# Patient Record
Sex: Female | Born: 1935 | Race: White | Hispanic: No | Marital: Married | Smoking: Never smoker
Health system: Southern US, Community
[De-identification: ages and names within clinical notes are randomized; demographics above are authoritative.]

## PROBLEM LIST (undated history)

## (undated) DIAGNOSIS — R682 Dry mouth, unspecified: Secondary | ICD-10-CM

## (undated) DIAGNOSIS — F419 Anxiety disorder, unspecified: Secondary | ICD-10-CM

## (undated) DIAGNOSIS — E785 Hyperlipidemia, unspecified: Secondary | ICD-10-CM

## (undated) DIAGNOSIS — C801 Malignant (primary) neoplasm, unspecified: Secondary | ICD-10-CM

## (undated) DIAGNOSIS — F0280 Dementia in other diseases classified elsewhere without behavioral disturbance: Secondary | ICD-10-CM

## (undated) DIAGNOSIS — M1990 Unspecified osteoarthritis, unspecified site: Secondary | ICD-10-CM

## (undated) DIAGNOSIS — G309 Alzheimer's disease, unspecified: Secondary | ICD-10-CM

## (undated) DIAGNOSIS — F32A Depression, unspecified: Secondary | ICD-10-CM

## (undated) DIAGNOSIS — I10 Essential (primary) hypertension: Secondary | ICD-10-CM

## (undated) DIAGNOSIS — F329 Major depressive disorder, single episode, unspecified: Secondary | ICD-10-CM

## (undated) HISTORY — PX: CHOLECYSTECTOMY: SHX55

## (undated) HISTORY — PX: APPENDECTOMY: SHX54

## (undated) HISTORY — PX: ABDOMINAL HYSTERECTOMY: SHX81

## (undated) HISTORY — PX: REPLACEMENT TOTAL KNEE BILATERAL: SUR1225

---

## 1988-12-09 HISTORY — PX: MASTECTOMY: SHX3

## 2004-11-08 ENCOUNTER — Ambulatory Visit: Payer: Self-pay | Admitting: Unknown Physician Specialty

## 2004-11-15 ENCOUNTER — Other Ambulatory Visit: Payer: Self-pay

## 2004-11-22 ENCOUNTER — Ambulatory Visit: Payer: Self-pay | Admitting: Unknown Physician Specialty

## 2005-05-25 ENCOUNTER — Inpatient Hospital Stay: Payer: Self-pay | Admitting: Internal Medicine

## 2005-05-25 ENCOUNTER — Other Ambulatory Visit: Payer: Self-pay

## 2005-06-05 ENCOUNTER — Ambulatory Visit: Payer: Self-pay | Admitting: Internal Medicine

## 2005-06-25 ENCOUNTER — Ambulatory Visit: Payer: Self-pay | Admitting: Internal Medicine

## 2005-08-19 ENCOUNTER — Other Ambulatory Visit: Payer: Self-pay

## 2005-08-27 ENCOUNTER — Inpatient Hospital Stay: Payer: Self-pay | Admitting: General Practice

## 2006-06-25 ENCOUNTER — Ambulatory Visit: Payer: Self-pay | Admitting: General Practice

## 2006-07-25 ENCOUNTER — Ambulatory Visit: Payer: Self-pay | Admitting: General Practice

## 2007-06-09 ENCOUNTER — Ambulatory Visit: Payer: Self-pay | Admitting: General Practice

## 2007-07-07 ENCOUNTER — Emergency Department: Payer: Self-pay | Admitting: Emergency Medicine

## 2007-08-03 ENCOUNTER — Other Ambulatory Visit: Payer: Self-pay

## 2007-08-03 ENCOUNTER — Ambulatory Visit: Payer: Self-pay | Admitting: General Practice

## 2007-08-19 ENCOUNTER — Inpatient Hospital Stay: Payer: Self-pay | Admitting: General Practice

## 2007-09-07 ENCOUNTER — Encounter: Payer: Self-pay | Admitting: General Practice

## 2007-09-09 ENCOUNTER — Encounter: Payer: Self-pay | Admitting: General Practice

## 2007-09-29 ENCOUNTER — Ambulatory Visit: Payer: Self-pay | Admitting: Psychiatry

## 2007-10-10 ENCOUNTER — Encounter: Payer: Self-pay | Admitting: General Practice

## 2007-11-18 ENCOUNTER — Ambulatory Visit: Payer: Self-pay | Admitting: General Practice

## 2010-09-03 ENCOUNTER — Inpatient Hospital Stay: Payer: Self-pay | Admitting: Internal Medicine

## 2012-07-23 ENCOUNTER — Emergency Department: Payer: Self-pay | Admitting: Emergency Medicine

## 2012-07-23 LAB — COMPREHENSIVE METABOLIC PANEL
Albumin: 3.3 g/dL — ABNORMAL LOW (ref 3.4–5.0)
Anion Gap: 10 (ref 7–16)
BUN: 10 mg/dL (ref 7–18)
Bilirubin,Total: 0.5 mg/dL (ref 0.2–1.0)
Co2: 25 mmol/L (ref 21–32)
Creatinine: 0.8 mg/dL (ref 0.60–1.30)
EGFR (Non-African Amer.): >60
Glucose: 100 mg/dL — ABNORMAL HIGH (ref 65–99)
Osmolality: 256 (ref 275–301)
Potassium: 4.2 mmol/L (ref 3.5–5.1)
SGOT(AST): 27 U/L (ref 15–37)
Sodium: 128 mmol/L — ABNORMAL LOW (ref 136–145)
Total Protein: 7.4 g/dL (ref 6.4–8.2)

## 2012-07-23 LAB — CBC
HCT: 35.8 % (ref 35.0–47.0)
HGB: 11.9 g/dL — ABNORMAL LOW (ref 12.0–16.0)
MCH: 29.3 pg (ref 26.0–34.0)
MCV: 89 fL (ref 80–100)
RDW: 13.7 % (ref 11.5–14.5)
WBC: 8.4 10*3/uL (ref 3.6–11.0)

## 2012-07-23 LAB — URINALYSIS, COMPLETE
Nitrite: NEGATIVE
Ph: 6 (ref 4.5–8.0)
Protein: 100
RBC,UR: 39 /HPF (ref 0–5)
Specific Gravity: 1.021 (ref 1.003–1.030)
WBC UR: 14 /HPF (ref 0–5)

## 2013-04-24 ENCOUNTER — Emergency Department: Payer: Self-pay | Admitting: Emergency Medicine

## 2014-02-06 ENCOUNTER — Ambulatory Visit: Payer: Self-pay | Admitting: Internal Medicine

## 2014-02-13 ENCOUNTER — Inpatient Hospital Stay: Payer: Self-pay | Admitting: Family Medicine

## 2014-02-13 LAB — URINALYSIS, COMPLETE
BILIRUBIN, UR: NEGATIVE
Bacteria: NONE SEEN
Glucose,UR: NEGATIVE mg/dL (ref 0–75)
Ketone: NEGATIVE
Leukocyte Esterase: NEGATIVE
Nitrite: NEGATIVE
PH: 6 (ref 4.5–8.0)
PROTEIN: NEGATIVE
RBC,UR: 2 /HPF (ref 0–5)
Specific Gravity: 1.004 (ref 1.003–1.030)
Squamous Epithelial: 1
WBC UR: 1 /HPF (ref 0–5)

## 2014-02-13 LAB — CBC WITH DIFFERENTIAL/PLATELET
BASOS ABS: 0 10*3/uL (ref 0.0–0.1)
Basophil %: 0.7 %
EOS ABS: 0 10*3/uL (ref 0.0–0.7)
EOS PCT: 0.3 %
HCT: 31.9 % — AB (ref 35.0–47.0)
HGB: 10.2 g/dL — AB (ref 12.0–16.0)
LYMPHS ABS: 0.9 10*3/uL — AB (ref 1.0–3.6)
LYMPHS PCT: 13 %
MCH: 25 pg — ABNORMAL LOW (ref 26.0–34.0)
MCHC: 31.9 g/dL — ABNORMAL LOW (ref 32.0–36.0)
MCV: 79 fL — AB (ref 80–100)
MONOS PCT: 9.7 %
Monocyte #: 0.7 x10 3/mm (ref 0.2–0.9)
NEUTROS ABS: 5.4 10*3/uL (ref 1.4–6.5)
Neutrophil %: 76.3 %
Platelet: 315 10*3/uL (ref 150–440)
RBC: 4.07 10*6/uL (ref 3.80–5.20)
RDW: 15 % — AB (ref 11.5–14.5)
WBC: 7.1 10*3/uL (ref 3.6–11.0)

## 2014-02-13 LAB — BASIC METABOLIC PANEL
ANION GAP: 9 (ref 7–16)
BUN: 12 mg/dL (ref 7–18)
CO2: 25 mmol/L (ref 21–32)
Calcium, Total: 8.3 mg/dL — ABNORMAL LOW (ref 8.5–10.1)
Chloride: 81 mmol/L — ABNORMAL LOW (ref 98–107)
Creatinine: 0.49 mg/dL — ABNORMAL LOW (ref 0.60–1.30)
GLUCOSE: 97 mg/dL (ref 65–99)
OSMOLALITY: 233 (ref 275–301)
POTASSIUM: 3.3 mmol/L — AB (ref 3.5–5.1)
Sodium: 115 mmol/L — CL (ref 136–145)

## 2014-02-13 LAB — CK TOTAL AND CKMB (NOT AT ARMC)
CK, Total: 4014 U/L — ABNORMAL HIGH
CK-MB: 35.5 ng/mL — ABNORMAL HIGH (ref 0.5–3.6)

## 2014-02-13 LAB — TROPONIN I: Troponin-I: 0.03 ng/mL

## 2014-02-13 LAB — HEPATIC FUNCTION PANEL A (ARMC)
ALBUMIN: 2.6 g/dL — AB (ref 3.4–5.0)
ALK PHOS: 93 U/L
AST: 135 U/L — AB (ref 15–37)
Bilirubin, Direct: <0.1 mg/dL (ref 0.00–0.20)
Bilirubin,Total: 0.3 mg/dL (ref 0.2–1.0)
SGPT (ALT): 36 U/L (ref 12–78)
Total Protein: 7.2 g/dL (ref 6.4–8.2)

## 2014-02-13 LAB — MAGNESIUM: MAGNESIUM: 1.8 mg/dL

## 2014-02-14 LAB — COMPREHENSIVE METABOLIC PANEL
ALK PHOS: 78 U/L
AST: 89 U/L — AB (ref 15–37)
Albumin: 2.4 g/dL — ABNORMAL LOW (ref 3.4–5.0)
Anion Gap: 6 — ABNORMAL LOW (ref 7–16)
BILIRUBIN TOTAL: 0.2 mg/dL (ref 0.2–1.0)
BUN: 12 mg/dL (ref 7–18)
CO2: 26 mmol/L (ref 21–32)
Calcium, Total: 8.1 mg/dL — ABNORMAL LOW (ref 8.5–10.1)
Chloride: 97 mmol/L — ABNORMAL LOW (ref 98–107)
Creatinine: 0.47 mg/dL — ABNORMAL LOW (ref 0.60–1.30)
EGFR (African American): >60
GLUCOSE: 71 mg/dL (ref 65–99)
Osmolality: 257 (ref 275–301)
Potassium: 3.8 mmol/L (ref 3.5–5.1)
SGPT (ALT): 30 U/L (ref 12–78)
Sodium: 129 mmol/L — ABNORMAL LOW (ref 136–145)
Total Protein: 6.1 g/dL — ABNORMAL LOW (ref 6.4–8.2)

## 2014-02-14 LAB — CBC WITH DIFFERENTIAL/PLATELET
BASOS PCT: 0.7 %
Basophil #: 0 10*3/uL (ref 0.0–0.1)
EOS ABS: 0 10*3/uL (ref 0.0–0.7)
Eosinophil %: 0.6 %
HCT: 29.5 % — ABNORMAL LOW (ref 35.0–47.0)
HGB: 9.6 g/dL — ABNORMAL LOW (ref 12.0–16.0)
LYMPHS PCT: 25.3 %
Lymphocyte #: 1.1 10*3/uL (ref 1.0–3.6)
MCH: 25.6 pg — ABNORMAL LOW (ref 26.0–34.0)
MCHC: 32.4 g/dL (ref 32.0–36.0)
MCV: 79 fL — AB (ref 80–100)
Monocyte #: 0.6 x10 3/mm (ref 0.2–0.9)
Monocyte %: 13.4 %
Neutrophil #: 2.6 10*3/uL (ref 1.4–6.5)
Neutrophil %: 60 %
Platelet: 289 10*3/uL (ref 150–440)
RBC: 3.73 10*6/uL — AB (ref 3.80–5.20)
RDW: 15.5 % — ABNORMAL HIGH (ref 11.5–14.5)
WBC: 4.3 10*3/uL (ref 3.6–11.0)

## 2014-02-14 LAB — CK: CK, Total: 1955 U/L — ABNORMAL HIGH

## 2014-02-14 LAB — TSH: Thyroid Stimulating Horm: 1.55 u[IU]/mL

## 2014-02-15 LAB — CBC WITH DIFFERENTIAL/PLATELET
BASOS PCT: 0.8 %
Basophil #: 0 10*3/uL (ref 0.0–0.1)
EOS ABS: 0 10*3/uL (ref 0.0–0.7)
Eosinophil %: 0.7 %
HCT: 29.1 % — ABNORMAL LOW (ref 35.0–47.0)
HGB: 9.5 g/dL — ABNORMAL LOW (ref 12.0–16.0)
Lymphocyte #: 0.9 10*3/uL — ABNORMAL LOW (ref 1.0–3.6)
Lymphocyte %: 16.8 %
MCH: 26.3 pg (ref 26.0–34.0)
MCHC: 32.6 g/dL (ref 32.0–36.0)
MCV: 81 fL (ref 80–100)
MONO ABS: 0.7 x10 3/mm (ref 0.2–0.9)
Monocyte %: 13.4 %
NEUTROS PCT: 68.3 %
Neutrophil #: 3.7 10*3/uL (ref 1.4–6.5)
Platelet: 274 10*3/uL (ref 150–440)
RBC: 3.6 10*6/uL — ABNORMAL LOW (ref 3.80–5.20)
RDW: 15.9 % — ABNORMAL HIGH (ref 11.5–14.5)
WBC: 5.4 10*3/uL (ref 3.6–11.0)

## 2014-02-15 LAB — BASIC METABOLIC PANEL
ANION GAP: 3 — AB (ref 7–16)
BUN: 13 mg/dL (ref 7–18)
CALCIUM: 8.1 mg/dL — AB (ref 8.5–10.1)
Chloride: 104 mmol/L (ref 98–107)
Co2: 27 mmol/L (ref 21–32)
Creatinine: 0.5 mg/dL — ABNORMAL LOW (ref 0.60–1.30)
EGFR (African American): >60
GLUCOSE: 73 mg/dL (ref 65–99)
OSMOLALITY: 267 (ref 275–301)
POTASSIUM: 4.1 mmol/L (ref 3.5–5.1)
SODIUM: 134 mmol/L — AB (ref 136–145)

## 2014-02-15 LAB — CK
CK, TOTAL: 475 U/L — AB
CK, Total: 615 U/L — ABNORMAL HIGH

## 2014-02-16 LAB — CK: CK, Total: 396 U/L — ABNORMAL HIGH

## 2014-02-18 DIAGNOSIS — I10 Essential (primary) hypertension: Secondary | ICD-10-CM

## 2014-02-18 DIAGNOSIS — R404 Transient alteration of awareness: Secondary | ICD-10-CM

## 2014-02-18 DIAGNOSIS — M1990 Unspecified osteoarthritis, unspecified site: Secondary | ICD-10-CM

## 2014-02-18 DIAGNOSIS — K146 Glossodynia: Secondary | ICD-10-CM

## 2014-02-23 DIAGNOSIS — F430 Acute stress reaction: Secondary | ICD-10-CM

## 2014-02-23 DIAGNOSIS — R443 Hallucinations, unspecified: Secondary | ICD-10-CM

## 2014-02-25 DIAGNOSIS — R569 Unspecified convulsions: Secondary | ICD-10-CM

## 2014-03-09 ENCOUNTER — Ambulatory Visit: Payer: Self-pay | Admitting: Internal Medicine

## 2014-03-14 DIAGNOSIS — R609 Edema, unspecified: Secondary | ICD-10-CM

## 2014-07-11 ENCOUNTER — Ambulatory Visit: Payer: Self-pay | Admitting: Family Medicine

## 2015-04-01 NOTE — H&P (Signed)
PATIENT NAME:  Kelsey Morrison, Kelsey Morrison MR#:  [id number] DATE OF BIRTH:  1934-05-27  DATE OF ADMISSION:  02/13/2014  REFERRING PHYSICIAN: Dr. Kerman Passey  FAMILY PHYSICIAN: Dr. Jarome Lamas  REASON FOR ADMISSION: Altered mental status.   HISTORY OF PRESENT ILLNESS: The patient is a 81 year old female with a history of benign hypertension, breast cancer, osteoarthritis, and "burning mouth syndrome." Has been seen by an oral specialist in [place] for her burning mouth syndrome. Has painful eating and swallowing. Presents now with confusion. Brought by her family. Has not been eating or drinking as much over the past 3 to 4 days because of her mouth pain. In the Emergency Room, the patient was noted to have an elevated CPK consistent with rhabdomyolysis. She was also found to be profoundly hyponatremic and hypokalemic. She is now admitted for further evaluation. The patient is confused and unable to give history.   PAST MEDICAL HISTORY: 1.  Benign hypertension.  2.  Breast cancer, status post mastectomy.  3.  Osteoarthritis.  4.  Anxiety/depression.  5.  "Burning mouth syndrome."   6.  Status post left knee surgery.  7.  Status post cholecystectomy.  8.  Status post hysterectomy.   MEDICATIONS: 1.  Blood pressure pill, name and dose unknown.  2.  Klonopin 1 mg p.o. t.i.d. and 2 mg p.o. at bedtime.  3.  Neurontin 400 mg p.o. t.i.d.  4.  Lidocaine/Maalox suspension as needed.  6.  Mycelex troches 5 times per day.  7.  Percocet 5/325, 1 to 2 p.o. q. 6 hours p.r.n. pain.  8.  Vitamin C 500 mg p.o. daily.   ALLERGIES: NEXIUM, ZOCOR, ULTRAM, LIPITOR, CELEBREX, MOBIC, CIPRO, SULFA, and CELEXA.   SOCIAL HISTORY: Negative for alcohol or tobacco abuse.   FAMILY HISTORY: Noncontributory.   REVIEW OF SYSTEMS: Unable to obtain from patient.   PHYSICAL EXAMINATION: GENERAL: The patient is elderly, thin, cachectic, but in no acute distress.  VITAL SIGNS: Remarkable for a blood pressure of 132/66, heart  rate of 79, respiratory rate of 18, temperature of 97.6, sat 98% on room air.  HEENT: Normocephalic, atraumatic. Pupils equal, round and reactive to light and accommodation. Extraocular movements are intact. Sclerae are not icteric. Conjunctivae are clear. Oropharynx is dry, but clear.  NECK: Supple, without JVD. No adenopathy noted.  LUNGS: Clear to auscultation and percussion without wheezes, rales or rhonchi. No dullness. Respiratory effort is normal.  CARDIAC: Regular rate and rhythm with normal S1, S2. There are no rubs, murmurs or gallops. PMI nondisplaced. Chest wall is nontender.  ABDOMEN: Soft, nontender, with normoactive bowel sounds. No organomegaly or masses were appreciated. No hernias or bruits were noted.  EXTREMITIES: Without clubbing, cyanosis, edema. Pulses were 2+ bilaterally.  SKIN: Warm and dry, without rash or lesions.  NEUROLOGIC: Exam revealed cranial nerves II through XII grossly intact. Deep tendon reflexes are symmetric. Motor and sensory exams nonfocal.  PSYCHIATRIC: Exam revealed a patient who is alert and oriented to person only. She was not answering questions appropriately, and was repetitive.   LABORATORY DATA: Urinalysis was unremarkable. Glucose was 97, with a BUN of 12, a creatinine of 0.49, and a GFR of greater than 60. Her sodium was 115, with a potassium of 3.3. Albumin was 2.6. CK total was 4014, with an MB of 35.5. Troponin was 0.03. Magnesium 1.8. White count was 7.1, with a hemoglobin of 10.2.   ASSESSMENT: 1.  Anorexia due to mouth pain.  2.  Hyponatremia.  3.  Hypokalemia.  4.  Rhabdomyolysis.  5.  Anxiety/depression.  6.  History of breast cancer.  7.  Anemia of chronic disease.   PLAN: The patient will be admitted to the floor with IV fluids and potassium supplementation. Will begin a soft diet. Will hold her blood pressure medication at this time. Will consult ENT because of her mouth pain, as well as the dietitian for a calorie count and  assessment. Neuro checks q. 4 hours. Follow up routine labs in the morning. Further treatment and evaluation will depend upon the patient's progress.   Total time spent on this patient was 50 minutes.      ____________________________ Leonie Douglas Doy Hutching, MD jds:mr D: 02/13/2014 17:35:57 ET T: 02/13/2014 19:14:35 ET JOB#: [id number]  cc: Leonie Douglas. Doy Hutching, MD, <Dictator> Irven Easterly. Kary Kos, MD  JEFFREY Lennice Sites MD ELECTRONICALLY SIGNED 02/14/2014 7:57

## 2015-04-01 NOTE — Consult Note (Signed)
PATIENT NAME:  Kelsey Morrison, Kelsey Morrison MR#:  [id number] DATE OF BIRTH:  01/18/1934  DATE OF CONSULTATION:  02/14/2014  REFERRING PHYSICIAN:  Leonie Douglas. Doy Hutching, MD  CONSULTING PHYSICIAN:  Huey Romans, MD  FAMILY PHYSICIAN:  Irven Easterly. Kary Kos, MD  REASON FOR CONSULTATION:  Mouth and throat pain.   HISTORY OF PRESENT ILLNESS: The patient is a 81 year old white female with a history of burning mouth syndrome that has been going on now for 3 to 4 years according to the husband and 3 to 4 months according to the patient. She did say she has seen multiple doctors in [place] for it.  She was sent to [facility] to see what they could offer and then got sent to [facility] to see an oral specialist who diagnosed her with burning mouth syndrome and lichen planus. She was on some medication and felt like she was better in May and during some of the summer time, but the last several months it has gotten much worse.  She has seen Dr. Bridgett Larsson in regards to anxiety and depression and has been tried on multiple different medications by him to see if that would help solve the problem. She currently has been trying to control it with Duke's Magic Mouthwash, Biotene products, staying hydrated and recently she got much worse. She was admitted to the Emergency Room for not eating or drinking much in the past 3 to 4 days because of mouth pain and having elevated CPK consistent with rhabdomyolysis. She has been getting well hydrated here and her mouth seems to be better now. She is chewing gum currently to try to keep her mouth moist.   PAST MEDICAL HISTORY: Significant for by benign hypertension, breast cancer status post mastectomy, osteoarthritis, anxiety, depression and burning mouth syndrome.   PAST SURGICAL HISTORY: Significant for left knee surgery, cholecystectomy and hysterectomy.   CURRENT MEDICATIONS: Reviewed, as noted in the chart.   DRUG ALLERGIES: NEXIUM, ZOCOR, ULTRAM, LIPITOR, CELEBREX, MOBIC, CIPRO, SULFA AND  CELEXA.   SOCIAL HISTORY: Negative for alcohol or tobacco abuse.   PHYSICAL EXAMINATION:  HEENT:  The nose is open anteriorly. Her mouth shows her tongue to be moist. There are no oral lesions at all. No ulcerations on the tongue, dorsal surface nor the lateral surface.  Floor of mouth is clear. Teeth are in good repair.  There is no evidence of gum disease. There is no evidence of lichen planus on the cheeks, palate or the tongue at this time. There is no redness of the tongue or the posterior pharynx.  NECK: Negative for any nodes or masses.   IMPRESSION: The patient has had burning mouth syndrome, which has led to decreased oral intake and secondary rhabdomyolysis, now being well hydrated and her symptoms have improved . There is no specific cure, but topical treatment and oral care seems to be the best care.  She has been tried on oral medications to control nerve irritation by Dr. Bridgett Larsson and they have not been helpful.   PLAN:  Patient needs to continue with Duke's Magic Mouthwash for topical control. She needs to stay well hydrated and make sure her mouth does not dry out. She will continue to take liquids. She can chew on gum but should be a little careful about xylitol and sugar-free gum as that can be an irritant to the tissues. There is no evidence of yeast infection and she probably does not need to the Mycelex Troches long-term. She could use prednisone Dosepak periodically  when symptoms seem to worsen as this will help settle down symptoms temporarily, but will not solve the problem long-term. She also will use Biotene products for dental care and other oral mouth washes to help keep things lubricated as this seems to be the best over-the-counter products for her problem.   Thank you for the opportunity to assist in this patient's care.     ____________________________ Huey Romans, MD phj:mk D: 02/14/2014 20:44:06 ET T: 02/14/2014 21:08:41 ET JOB#: [id number]  cc: Huey Romans,  MD, <Dictator> Leonie Douglas. Doy Hutching, MD  Huey Romans MD ELECTRONICALLY SIGNED 02/21/2014 7:33

## 2015-04-01 NOTE — Discharge Summary (Signed)
PATIENT NAME:  Kelsey Morrison, Kelsey Morrison MR#:  [id number] DATE OF BIRTH:  August 12, 1934  DATE OF ADMISSION:  02/13/2014 DATE OF DISCHARGE:  02/16/2014  DISCHARGE DIAGNOSES: 1.  Burning mouth syndrome, improving and comfortable with Magic Mouthwash.  2.  Hyponatremia, resolved with hydration.  3.  Hypokalemia, repleted and resolved.  4.  Rhabdomyolysis, resolved with hydration. 5.  Acute delirium with possible underlying dementia, improving. Will require outpatient neurological followup.  SECONDARY DIAGNOSES: 1.  Hypertension.  2.  Breast cancer, status post mastectomy. 3.  Osteoarthritis.  4.  Anxiety/depression.  5.  Burning mouth syndrome.   CONSULTATIONS:  1.  ENT, Dr. Kathyrn Sheriff. 2.  Palliative care, Dr. Ermalinda Memos.  3.  Physical and occupational therapy.   PROCEDURES AND RADIOLOGY: None.   MAJOR LABORATORY PANEL: Urinalysis negative.   HISTORY AND SHORT HOSPITAL COURSE: The patient is a 81 year old female with above-mentioned medical problems who was admitted for altered mental status and anorexia, which was thought to be related to burning mouth syndrome. Please see Dr. Doy Hutching' dictated history and physical for further details. ENT consultation was obtained with Dr. Kathyrn Sheriff who recommended continue Magic Mouthwash and use steroid burst periodically for any flare-ups. He also recommended starting good multivitamin with multiminerals, which was done. The patient was instructed to use any Biotene products with good topical medication like Magic Mouthwash and candies to keep her mouth moist.   The patient was evaluated with palliative care, and decision was made to continue full aggressive care as now. After evaluation by physical and occupational therapy, decision was made to transfer her to rehab facility, and family was in agreement. On the date of discharge, her vital signs were as follows: Temperature 97.6, heart rate 82 per minute, respirations 18 per minute, blood pressure 126/65 mmHg. She was  saturating 97% on room air.   PERTINENT PHYSICAL EXAMINATION ON THE DATE OF DISCHARGE:  CARDIOVASCULAR: S1, S2 normal. No murmurs, rubs, or gallop.  LUNGS: Clear to auscultation bilaterally. No wheezing, rales, rhonchi, or crepitation.  ABDOMEN: Soft, benign.  NEUROLOGIC: Nonfocal examination.   All other physical examination remained at baseline.   DISCHARGE MEDICATIONS: 1.  Amlodipine 5 mg p.o. daily. 2.  Clotrimazole 10 mg p.o. b.i.d. as needed.  3.  Mirtazapine 15 mg 1/2 tablet p.o. at bedtime.  4.  Vitamin with multiminerals once daily.  5.  Duke's Magic Mouthwash 5 mL p.o. 5 times a day.  6.  Clonazepam 1 mg p.o. 4 times a day as needed.  7.  Ascorbic acid 500 mg p.o. daily. 8.  Saliva substitutes 4 sprays orally at bedtime.   DISCHARGE DIET: Low-sodium.   DISCHARGE ACTIVITY: As tolerated.   DISCHARGE INSTRUCTIONS AND FOLLOWUP: The patient was instructed to follow up with her primary care physician, Dr. Jarome Lamas, in 1 to 2 weeks. She will need followup with neurology from [facility] in 2 to 4 weeks and Dr. Kathyrn Sheriff from ENT in 4 to 6 weeks. She will get physical therapy evaluation and management while at the facility. She may need a GI followup with [facility] as needed for G-tube placement if family wishes and if patient in agreement.   TOTAL TIME DISCHARGING THIS PATIENT: 55 minutes.   ____________________________ Lucina Mellow. Manuella Ghazi, MD vss:jcm D: 02/16/2014 15:55:45 ET T: 02/16/2014 16:53:54 ET JOB#: [id number]  cc: Lissette Schenk S. Manuella Ghazi, MD, <Dictator> Irven Easterly. Kary Kos, MD Huey Romans, MD [facility] Lucina Mellow [facility] MD ELECTRONICALLY SIGNED 02/17/2014 19:55

## 2015-04-01 NOTE — Consult Note (Signed)
   Comments   Follow up visit made. Pt's husband now present. He says that pt has been tried on multiple medications including gabapentin. He is unsure of others but said pt's PCP, Dr Bridgett Larsson, would know. Will contact him.  the option of feeding tube with pt/husband as well as code status. Both pt and husband seem uncertain about goals. Husband is clear that pt would not want intubation but thinks she would want CPR/debibrillation. Pt says that she wants intubation but then changed her mind when she realized that she could not go home on a vent. Ultimately, we asked pt and husband to discuss further and include her son in discussion. They agreed to do this. We will follow up tomorrow.   Electronic Signatures: Trig Mcbryar, Izora Gala (MD)  (Signed 09-Mar-15 20:31)  Authored: Palliative Care   Last Updated: 09-Mar-15 20:31 by Graycie Halley, Izora Gala (MD)

## 2015-07-25 ENCOUNTER — Encounter: Payer: Self-pay | Admitting: Emergency Medicine

## 2015-07-25 ENCOUNTER — Observation Stay
Admission: EM | Admit: 2015-07-25 | Discharge: 2015-07-27 | Disposition: A | Discharge status: Home / Self Care | Payer: Medicare Other | Attending: Internal Medicine | Admitting: Internal Medicine

## 2015-07-25 DIAGNOSIS — Z7982 Long term (current) use of aspirin: Secondary | ICD-10-CM | POA: Insufficient documentation

## 2015-07-25 DIAGNOSIS — M064 Inflammatory polyarthropathy: Secondary | ICD-10-CM | POA: Diagnosis not present

## 2015-07-25 DIAGNOSIS — M1380 Other specified arthritis, unspecified site: Secondary | ICD-10-CM | POA: Insufficient documentation

## 2015-07-25 DIAGNOSIS — Z803 Family history of malignant neoplasm of breast: Secondary | ICD-10-CM | POA: Insufficient documentation

## 2015-07-25 DIAGNOSIS — Z90710 Acquired absence of both cervix and uterus: Secondary | ICD-10-CM | POA: Insufficient documentation

## 2015-07-25 DIAGNOSIS — R682 Dry mouth, unspecified: Secondary | ICD-10-CM | POA: Diagnosis not present

## 2015-07-25 DIAGNOSIS — M1990 Unspecified osteoarthritis, unspecified site: Secondary | ICD-10-CM | POA: Insufficient documentation

## 2015-07-25 DIAGNOSIS — F0280 Dementia in other diseases classified elsewhere without behavioral disturbance: Secondary | ICD-10-CM

## 2015-07-25 DIAGNOSIS — Z853 Personal history of malignant neoplasm of breast: Secondary | ICD-10-CM | POA: Insufficient documentation

## 2015-07-25 DIAGNOSIS — I351 Nonrheumatic aortic (valve) insufficiency: Secondary | ICD-10-CM | POA: Diagnosis not present

## 2015-07-25 DIAGNOSIS — I6523 Occlusion and stenosis of bilateral carotid arteries: Secondary | ICD-10-CM | POA: Insufficient documentation

## 2015-07-25 DIAGNOSIS — Z79899 Other long term (current) drug therapy: Secondary | ICD-10-CM | POA: Diagnosis not present

## 2015-07-25 DIAGNOSIS — Z888 Allergy status to other drugs, medicaments and biological substances status: Secondary | ICD-10-CM | POA: Insufficient documentation

## 2015-07-25 DIAGNOSIS — Z9049 Acquired absence of other specified parts of digestive tract: Secondary | ICD-10-CM | POA: Diagnosis not present

## 2015-07-25 DIAGNOSIS — F419 Anxiety disorder, unspecified: Secondary | ICD-10-CM | POA: Diagnosis not present

## 2015-07-25 DIAGNOSIS — I10 Essential (primary) hypertension: Secondary | ICD-10-CM | POA: Diagnosis not present

## 2015-07-25 DIAGNOSIS — E785 Hyperlipidemia, unspecified: Secondary | ICD-10-CM | POA: Insufficient documentation

## 2015-07-25 DIAGNOSIS — Z825 Family history of asthma and other chronic lower respiratory diseases: Secondary | ICD-10-CM | POA: Diagnosis not present

## 2015-07-25 DIAGNOSIS — G309 Alzheimer's disease, unspecified: Secondary | ICD-10-CM | POA: Diagnosis not present

## 2015-07-25 DIAGNOSIS — Z96653 Presence of artificial knee joint, bilateral: Secondary | ICD-10-CM | POA: Diagnosis not present

## 2015-07-25 DIAGNOSIS — I352 Nonrheumatic aortic (valve) stenosis with insufficiency: Secondary | ICD-10-CM | POA: Diagnosis not present

## 2015-07-25 DIAGNOSIS — R55 Syncope and collapse: Principal | ICD-10-CM | POA: Insufficient documentation

## 2015-07-25 DIAGNOSIS — F329 Major depressive disorder, single episode, unspecified: Secondary | ICD-10-CM | POA: Insufficient documentation

## 2015-07-25 DIAGNOSIS — Z885 Allergy status to narcotic agent status: Secondary | ICD-10-CM | POA: Diagnosis not present

## 2015-07-25 HISTORY — DX: Dementia in other diseases classified elsewhere, unspecified severity, without behavioral disturbance, psychotic disturbance, mood disturbance, and anxiety: F02.80

## 2015-07-25 HISTORY — DX: Dry mouth, unspecified: R68.2

## 2015-07-25 HISTORY — DX: Alzheimer's disease, unspecified: G30.9

## 2015-07-25 HISTORY — DX: Essential (primary) hypertension: I10

## 2015-07-25 HISTORY — DX: Hyperlipidemia, unspecified: E78.5

## 2015-07-25 HISTORY — DX: Unspecified osteoarthritis, unspecified site: M19.90

## 2015-07-25 HISTORY — DX: Major depressive disorder, single episode, unspecified: F32.9

## 2015-07-25 HISTORY — DX: Depression, unspecified: F32.A

## 2015-07-25 HISTORY — DX: Anxiety disorder, unspecified: F41.9

## 2015-07-25 HISTORY — DX: Malignant (primary) neoplasm, unspecified: C80.1

## 2015-07-25 LAB — URINALYSIS COMPLETE WITH MICROSCOPIC (ARMC ONLY)
BACTERIA UA: NONE SEEN
Bilirubin Urine: NEGATIVE
GLUCOSE, UA: NEGATIVE mg/dL
Ketones, ur: NEGATIVE mg/dL
NITRITE: NEGATIVE
PROTEIN: NEGATIVE mg/dL
SPECIFIC GRAVITY, URINE: 1.005 (ref 1.005–1.030)
pH: 7 (ref 5.0–8.0)

## 2015-07-25 LAB — BASIC METABOLIC PANEL
Anion gap: 11 (ref 5–15)
BUN: 14 mg/dL (ref 6–20)
CALCIUM: 9 mg/dL (ref 8.9–10.3)
CO2: 25 mmol/L (ref 22–32)
CREATININE: 0.91 mg/dL (ref 0.44–1.00)
Chloride: 100 mmol/L — ABNORMAL LOW (ref 101–111)
GFR calc non Af Amer: 59 mL/min — ABNORMAL LOW (ref >60)
Glucose, Bld: 124 mg/dL — ABNORMAL HIGH (ref 65–99)
Potassium: 3.4 mmol/L — ABNORMAL LOW (ref 3.5–5.1)
Sodium: 136 mmol/L (ref 135–145)

## 2015-07-25 LAB — MAGNESIUM: MAGNESIUM: 1.9 mg/dL (ref 1.7–2.4)

## 2015-07-25 LAB — CBC
HCT: 38 % (ref 35.0–47.0)
Hemoglobin: 12.1 g/dL (ref 12.0–16.0)
MCH: 28.9 pg (ref 26.0–34.0)
MCHC: 31.8 g/dL — ABNORMAL LOW (ref 32.0–36.0)
MCV: 90.9 fL (ref 80.0–100.0)
PLATELETS: 240 10*3/uL (ref 150–440)
RBC: 4.18 MIL/uL (ref 3.80–5.20)
RDW: 14.3 % (ref 11.5–14.5)
WBC: 12.6 10*3/uL — ABNORMAL HIGH (ref 3.6–11.0)

## 2015-07-25 LAB — GLUCOSE, CAPILLARY: GLUCOSE-CAPILLARY: 124 mg/dL — AB (ref 65–99)

## 2015-07-25 LAB — TSH: TSH: 1.12 u[IU]/mL (ref 0.350–4.500)

## 2015-07-25 LAB — TROPONIN I: Troponin I: <0.03 ng/mL (ref <0.031)

## 2015-07-25 LAB — PHOSPHORUS: Phosphorus: 3.6 mg/dL (ref 2.5–4.6)

## 2015-07-25 MED ORDER — PREDNISONE 5 MG PO TABS
5.0000 mg | ORAL_TABLET | Freq: Every day | ORAL | Status: DC
  Administered 2015-07-26 – 2015-07-27 (×2): 5 mg via ORAL
  Filled 2015-07-25 (×2): qty 1

## 2015-07-25 MED ORDER — ACETAMINOPHEN 650 MG RE SUPP: 650.0000 mg | Freq: Four times a day (QID) | RECTAL | Status: DC | PRN

## 2015-07-25 MED ORDER — SODIUM CHLORIDE 0.9 % IV SOLN
INTRAVENOUS | Status: AC
Start: 2015-07-25 — End: 2015-07-26
  Administered 2015-07-25 – 2015-07-26 (×2): via INTRAVENOUS

## 2015-07-25 MED ORDER — SODIUM CHLORIDE 0.9 % IJ SOLN
3.0000 mL | Freq: Two times a day (BID) | INTRAMUSCULAR | Status: DC
  Administered 2015-07-26 – 2015-07-27 (×2): 3 mL via INTRAVENOUS

## 2015-07-25 MED ORDER — SODIUM CHLORIDE 0.9 % IV BOLUS (SEPSIS)
1000.0000 mL | Freq: Once | INTRAVENOUS | Status: AC
  Administered 2015-07-25: 1000 mL via INTRAVENOUS

## 2015-07-25 MED ORDER — MELATONIN 5 MG PO TABS: 1.0000 | ORAL_TABLET | Freq: Every day | ORAL | Status: DC

## 2015-07-25 MED ORDER — BIOTENE DRY MOUTH MT LIQD
15.0000 mL | Freq: Every day | OROMUCOSAL | Status: DC
  Administered 2015-07-25 – 2015-07-26 (×2): 15 mL via OROMUCOSAL
  Filled 2015-07-25 (×3): qty 15

## 2015-07-25 MED ORDER — DIPHENHYDRAMINE HCL 25 MG PO CAPS
25.0000 mg | ORAL_CAPSULE | Freq: Every evening | ORAL | Status: DC | PRN
  Administered 2015-07-25 – 2015-07-26 (×2): 25 mg via ORAL
  Filled 2015-07-25 (×2): qty 1

## 2015-07-25 MED ORDER — SALINE SPRAY 0.65 % NA SOLN: 2.0000 | NASAL | Status: DC | PRN

## 2015-07-25 MED ORDER — ENOXAPARIN SODIUM 40 MG/0.4ML ~~LOC~~ SOLN
40.0000 mg | SUBCUTANEOUS | Status: DC
  Administered 2015-07-26: 40 mg via SUBCUTANEOUS
  Filled 2015-07-25: qty 0.4

## 2015-07-25 MED ORDER — CLOTRIMAZOLE 10 MG MT TROC
10.0000 mg | Freq: Three times a day (TID) | OROMUCOSAL | Status: DC
  Administered 2015-07-25 – 2015-07-27 (×4): 10 mg via ORAL
  Filled 2015-07-25 (×7): qty 1

## 2015-07-25 MED ORDER — AMLODIPINE BESYLATE 5 MG PO TABS
5.0000 mg | ORAL_TABLET | Freq: Every day | ORAL | Status: DC
  Administered 2015-07-26 – 2015-07-27 (×2): 5 mg via ORAL
  Filled 2015-07-25 (×2): qty 1

## 2015-07-25 MED ORDER — MIRTAZAPINE 15 MG PO TABS
7.5000 mg | ORAL_TABLET | Freq: Every day | ORAL | Status: DC
  Administered 2015-07-25 – 2015-07-26 (×2): 7.5 mg via ORAL
  Filled 2015-07-25 (×2): qty 1

## 2015-07-25 MED ORDER — CLONAZEPAM 0.5 MG PO TABS
0.5000 mg | ORAL_TABLET | Freq: Every day | ORAL | Status: DC
  Administered 2015-07-25 – 2015-07-26 (×2): 0.5 mg via ORAL
  Filled 2015-07-25 (×2): qty 1

## 2015-07-25 MED ORDER — ACETAMINOPHEN 325 MG PO TABS: 650.0000 mg | ORAL_TABLET | Freq: Four times a day (QID) | ORAL | Status: DC | PRN

## 2015-07-25 MED ORDER — ASPIRIN EC 81 MG PO TBEC
81.0000 mg | DELAYED_RELEASE_TABLET | Freq: Every day | ORAL | Status: DC
  Administered 2015-07-25 – 2015-07-26 (×2): 81 mg via ORAL
  Filled 2015-07-25 (×2): qty 1

## 2015-07-25 MED ORDER — OLANZAPINE 2.5 MG PO TABS
1.2500 mg | ORAL_TABLET | Freq: Two times a day (BID) | ORAL | Status: DC
  Administered 2015-07-25 – 2015-07-27 (×4): 1.25 mg via ORAL
  Filled 2015-07-25 (×3): qty 0.5
  Filled 2015-07-25 (×2): qty 1

## 2015-07-25 MED ORDER — MEMANTINE HCL 10 MG PO TABS
10.0000 mg | ORAL_TABLET | Freq: Two times a day (BID) | ORAL | Status: DC
  Administered 2015-07-25 – 2015-07-27 (×4): 10 mg via ORAL
  Filled 2015-07-25 (×5): qty 1

## 2015-07-25 NOTE — H&P (Signed)
[facility] [place] NAME: Kelsey Morrison    MR#:  [id number]  DATE OF BIRTH:  15-Aug-1934  DATE OF ADMISSION:  07/25/2015  PRIMARY CARE PHYSICIAN: [place] Pink, MD   REQUESTING/REFERRING PHYSICIAN: Jacqualine Code, MD  CHIEF COMPLAINT:   Chief Complaint  Patient presents with  . Fall    HISTORY OF PRESENT ILLNESS:  Kelsey Morrison  is a 81 y.o. female who presents with multiple episodes of syncope today. Patient has dementia and is unable to give a complete history, but she is accompanied by her husband in the ED today reports that during breakfast she had a syncopal episode when she stood up. He states that he is unsure how long she was out, but when he got her up from the floor she passed out again. He then brought her to the ED for evaluation, and the patient had 2 further syncopal episodes here in the ED, one of them was in a seated position wheelchair. Initial evaluation in the ED shows no significant abnormalities in her labs, orthostatic vital signs were within normal limits. Patient denies any prior diagnosis of arrhythmia or cardiac problems. Hospitalists were called for admission for syncope.  PAST MEDICAL HISTORY:   Past Medical History  Diagnosis Date  . Anxiety   . Hypertension   . Alzheimer's dementia   . Cancer     breast cancer  . Dry mouth   . Osteoarthritis   . Depression   . HLD (hyperlipidemia)   . Inflammatory arthritis     seronegative    PAST SURGICAL HISTORY:   Past Surgical History  Procedure Laterality Date  . Abdominal hysterectomy    . Replacement total knee bilateral    . Mastectomy Right 1990  . Cholecystectomy    . Appendectomy      SOCIAL HISTORY:   Social History  Substance Use Topics  . Smoking status: Never Smoker   . Smokeless tobacco: Not on file  . Alcohol Use: No    FAMILY HISTORY:   Family History  Problem Relation Age of Onset  . COPD Sister   . Breast cancer Sister   .  Breast cancer Mother     DRUG ALLERGIES:   Allergies  Allergen Reactions  . Celecoxib Other (See Comments)  . Hydrocodone Other (See Comments)    Altered mental status    MEDICATIONS AT HOME:   Prior to Admission medications   Medication Sig Start Date End Date Taking? Authorizing Provider  amLODipine (NORVASC) 5 MG tablet Take 5 mg by mouth daily.   Yes Historical Provider, MD  antiseptic oral rinse (BIOTENE) LIQD 15 mLs by Mouth Rinse route at bedtime.   Yes Historical Provider, MD  aspirin EC 81 MG tablet Take 81 mg by mouth at bedtime.   Yes Historical Provider, MD  calcium citrate (CALCITRATE - DOSED IN MG ELEMENTAL CALCIUM) 950 MG tablet Take 200 mg of elemental calcium by mouth daily.   Yes Historical Provider, MD  clonazePAM (KLONOPIN) 0.5 MG tablet Take 0.5 mg by mouth at bedtime.   Yes Historical Provider, MD  clotrimazole (MYCELEX) 10 MG troche Take 10 mg by mouth 3 (three) times daily.   Yes Historical Provider, MD  Melatonin 5 MG TABS Take 1 tablet by mouth at bedtime.   Yes Historical Provider, MD  memantine (NAMENDA) 10 MG tablet Take 10 mg by mouth 2 (two) times daily.   Yes Historical Provider, MD  mirtazapine (REMERON) 7.5 MG  tablet Take 7.5 mg by mouth at bedtime.   Yes Historical Provider, MD  OLANZapine (ZYPREXA) 2.5 MG tablet Take 1.25 mg by mouth 2 (two) times daily.   Yes Historical Provider, MD  predniSONE (DELTASONE) 5 MG tablet Take 5 mg by mouth daily with breakfast.   Yes Historical Provider, MD    REVIEW OF SYSTEMS:  Review of Systems  Constitutional: Negative for fever, chills, weight loss and malaise/fatigue.  HENT: Negative for ear pain, hearing loss and tinnitus.   Eyes: Negative for blurred vision, double vision, pain and redness.  Respiratory: Negative for cough, hemoptysis and shortness of breath.   Cardiovascular: Negative for chest pain, palpitations, orthopnea and leg swelling.  Gastrointestinal: Negative for nausea, vomiting, abdominal  pain, diarrhea and constipation.  Genitourinary: Negative for dysuria, frequency and hematuria.  Musculoskeletal: Negative for back pain, joint pain and neck pain.  Skin:       No acne, rash, or lesions  Neurological: Positive for loss of consciousness (4 syncopal episodes today.). Negative for dizziness, tremors, focal weakness and weakness.  Endo/Heme/Allergies: Negative for polydipsia. Does not bruise/bleed easily.  Psychiatric/Behavioral: Negative for depression. The patient is not nervous/anxious and does not have insomnia.      VITAL SIGNS:   Filed Vitals:   07/25/15 1230 07/25/15 1300 07/25/15 1330 07/25/15 1400  BP: 149/72 147/67 154/73 161/83  Pulse: 84 82 88 90  Temp:      TempSrc:      Resp: 18 12 25 17   Height:      Weight:      SpO2: 100% 97% 96% 94%   Wt Readings from Last 3 Encounters:  07/25/15 61.236 kg (135 lb)    PHYSICAL EXAMINATION:  Physical Exam  Vitals reviewed. Constitutional: She is oriented to person, place, and time. She appears well-developed and well-nourished. No distress.  HENT:  Head: Normocephalic and atraumatic.  Mouth/Throat: Oropharynx is clear and moist.  Eyes: Conjunctivae and EOM are normal. Pupils are equal, round, and reactive to light. No scleral icterus.  Neck: Normal range of motion. Neck supple. No JVD present. No thyromegaly present.  Cardiovascular: Normal rate, regular rhythm and intact distal pulses.  Exam reveals no gallop and no friction rub.   No murmur heard. Respiratory: Effort normal and breath sounds normal. No respiratory distress. She has no wheezes. She has no rales.  GI: Soft. Bowel sounds are normal. She exhibits no distension. There is no tenderness.  Musculoskeletal: Normal range of motion. She exhibits no edema.  No arthritis, no gout  Lymphadenopathy:    She has no cervical adenopathy.  Neurological: She is alert and oriented to person, place, and time. No cranial nerve deficit.  No dysarthria, no aphasia   Skin: Skin is warm and dry. No rash noted. No erythema.  Psychiatric: She has a normal mood and affect. Her behavior is normal. Judgment and thought content normal.  Mild dementia    LABORATORY PANEL:   CBC  Recent Labs Lab 07/25/15 1104  WBC 12.6*  HGB 12.1  HCT 38.0  PLT 240   ------------------------------------------------------------------------------------------------------------------  Chemistries   Recent Labs Lab 07/25/15 1104  NA 136  K 3.4*  CL 100*  CO2 25  GLUCOSE 124*  BUN 14  CREATININE 0.91  CALCIUM 9.0   ------------------------------------------------------------------------------------------------------------------  Cardiac Enzymes  Recent Labs Lab 07/25/15 1104  TROPONINI <0.03   ------------------------------------------------------------------------------------------------------------------  RADIOLOGY:  No results found.  EKG:   Orders placed or performed during the hospital encounter of 07/25/15  .  ED EKG  . ED EKG    IMPRESSION AND PLAN:  Principal Problem:   Syncope and collapse - unclear etiology at this time. No report of seizure activity, bowel or bladder incontinence, postictal state. On exam, patient does not have any focal weakness or other focal deficit. At this time, some intermittent arrhythmia is a high likelihood as a cause for his syncopal episodes. We'll keep her on telemetry overnight, get an echocardiogram, a cardiology consult, trend her troponins. Based on the results of this initial workup, could consider carotid Dopplers as well. Active Problems:   HTN (hypertension) - continue home medication to control this.   Anxiety - continue home medication for this.   Alzheimer's dementia - continue home meds for this.   Inflammatory arthritis - continue home medications for this   Dry mouth - continue home meds  All the records are reviewed and case discussed with ED provider. Management plans discussed with the  patient and/or family.  DVT PROPHYLAXIS: SubQ lovenox  ADMISSION STATUS: Observation  CODE STATUS: Full Advance Directive Documentation        Most Recent Value   Type of Advance Directive  Healthcare Power of Attorney   Pre-existing out of facility DNR order (yellow form or pink MOST form)     "MOST" Form in Place?        TOTAL TIME TAKING CARE OF THIS PATIENT: 40 minutes.    Kelsey Morrison FIELDING 07/25/2015, 3:27 PM  [facility]  Office  [phone]  CC: Primary care physician; [place] Pink, MD

## 2015-07-25 NOTE — ED Notes (Signed)
Pt from triage responsive to pain, EDT reports she was starting the EKG on the patient and pt became unresponsive. Pt slowly became responsive in room, able to tell this RN her name and where she was. Pt denies any pain at this time. Family reports pt had similar episodes this morning.

## 2015-07-25 NOTE — ED Provider Notes (Signed)
[facility] Emergency Department Provider Note  ____________________________________________  Time seen: Approximately 1215 PM  I have reviewed the triage vital signs and the nursing notes.   HISTORY  Chief Complaint Fall    HPI Kelsey Morrison is a 81 y.o. female history of hypertension, Alzheimer's disease who is seated having breakfast this morning at [place] her husband witnessed her suddenly fall off and blackout. He reports that she became very weak, passed out and then fell to the ground. She denies any headache or head injury. No neck pain. No chest pain or trouble breathing. No other concerns or symptoms. He stood her up, and she became weak and then passed out yet again for about 30 seconds.  About a the emergency room, while sitting up to have her blood pressure taken in the emergency room, she passed out and additional 2 times for approximately 30 seconds in the seated position. Her symptoms improved with laying flat.  No pain. Son and family reports she eats very poorly, and has been dehydrated in the past. Son also reports she has a history of low blood count in the past and had a previous transfusion perhaps once.  Patient does have some dementia, and poor recall but does not recall passing out twice at triage as witnessed by nurse.   Past Medical History  Diagnosis Date  . Anxiety   . Hypertension   . Alzheimer's dementia   . Cancer     breast cancer  . Dry mouth   . Osteoarthritis   . Depression   . HLD (hyperlipidemia)     There are no active problems to display for this patient.   Past Surgical History  Procedure Laterality Date  . Abdominal hysterectomy    . Replacement total knee bilateral    . Mastectomy Right 1990  . Cholecystectomy    . Appendectomy      Current Outpatient Rx  Name  Route  Sig  Dispense  Refill  . amLODipine (NORVASC) 5 MG tablet   Oral   Take 5 mg by mouth daily.         Marland Kitchen antiseptic oral  rinse (BIOTENE) LIQD   Mouth Rinse   15 mLs by Mouth Rinse route at bedtime.         Marland Kitchen aspirin EC 81 MG tablet   Oral   Take 81 mg by mouth at bedtime.         . calcium citrate (CALCITRATE - DOSED IN MG ELEMENTAL CALCIUM) 950 MG tablet   Oral   Take 200 mg of elemental calcium by mouth daily.         . clonazePAM (KLONOPIN) 0.5 MG tablet   Oral   Take 0.5 mg by mouth at bedtime.         . clotrimazole (MYCELEX) 10 MG troche   Oral   Take 10 mg by mouth 3 (three) times daily.         . Melatonin 5 MG TABS   Oral   Take 1 tablet by mouth at bedtime.         . memantine (NAMENDA) 10 MG tablet   Oral   Take 10 mg by mouth 2 (two) times daily.         . mirtazapine (REMERON) 7.5 MG tablet   Oral   Take 7.5 mg by mouth at bedtime.         Marland Kitchen OLANZapine (ZYPREXA) 2.5 MG tablet   Oral  Take 1.25 mg by mouth 2 (two) times daily.         . predniSONE (DELTASONE) 5 MG tablet   Oral   Take 5 mg by mouth daily with breakfast.           Allergies Celecoxib and Hydrocodone  Family History  Problem Relation Age of Onset  . COPD Sister   . Breast cancer Sister   . Breast cancer Mother     Social History Social History  Substance Use Topics  . Smoking status: Never Smoker   . Smokeless tobacco: None  . Alcohol Use: No    Review of Systems Constitutional: No fever/chills Eyes: No visual changes. ENT: No sore throat. Cardiovascular: Denies chest pain. Respiratory: Denies shortness of breath. Gastrointestinal: No abdominal pain.  No nausea, no vomiting.  No diarrhea.  No constipation. Genitourinary: Negative for dysuria. Musculoskeletal: Negative for back pain. Skin: Negative for rash. Neurological: Negative for headaches, focal weakness or numbness.    10-point ROS otherwise negative.  ____________________________________________   PHYSICAL EXAM:  VITAL SIGNS: ED Triage Vitals  Enc Vitals Group     BP 07/25/15 1033 123/61 mmHg      Pulse Rate 07/25/15 1033 83     Resp 07/25/15 1033 20     Temp 07/25/15 1033 98.2 F (36.8 C)     Temp Source 07/25/15 1033 Oral     SpO2 07/25/15 1033 100 %     Weight 07/25/15 1033 135 lb (61.236 kg)     Height 07/25/15 1033 5\' 6"  (1.676 m)     Head Cir --      Peak Flow --      Pain Score 07/25/15 1034 3     Pain Loc --      Pain Edu? --      Excl. in [place]? --     Constitutional: Alert and oriented to self, place, but not recalling passing out at triage. Well appearing and in no acute distress. Eyes: Conjunctivae are normal. PERRL. EOMI. Head: Atraumatic. No cervical spine tenderness. Nose: No congestion/rhinnorhea. No hemotympanum. Mouth/Throat: Mucous membranes are moist.  Oropharynx non-erythematous. Neck: No stridor.   Cardiovascular: Normal rate, regular rhythm. Grossly normal heart sounds.  Good peripheral circulation. Respiratory: Normal respiratory effort.  No retractions. Lungs CTAB. Gastrointestinal: Soft and nontender. No distention. No abdominal bruits. No CVA tenderness. Musculoskeletal: No lower extremity tenderness nor edema.  No joint effusions. Neurologic:  Normal speech and language. No gross focal neurologic deficits are appreciated. 5 out of 5 strength all extremities. No facial droop. No dysarthria. No pronator drift. Skin:  Skin is warm, dry and intact. No rash noted. Psychiatric: Mood and affect are normal. Speech and behavior are normal.  ____________________________________________   LABS (all labs ordered are listed, but only abnormal results are displayed)  Labs Reviewed  BASIC METABOLIC PANEL - Abnormal; Notable for the following:    Potassium 3.4 (*)    Chloride 100 (*)    Glucose, Bld 124 (*)    GFR calc non Af Amer 59 (*)    All other components within normal limits  URINALYSIS COMPLETEWITH MICROSCOPIC ([facility] ONLY) - Abnormal; Notable for the following:    Color, Urine STRAW (*)    APPearance CLEAR (*)    Hgb urine dipstick 2+ (*)     Leukocytes, UA TRACE (*)    Squamous Epithelial / LPF 0-5 (*)    All other components within normal limits  TROPONIN I  CBC  CBG MONITORING,  ED   ____________________________________________  EKG  ED ECG REPORT I, Nashalie Sallis, the attending physician, personally viewed and interpreted this ECG.  Date: 07/25/2015 EKG Time: 1110 Rate: 70 Rhythm: normal sinus rhythm QRS Axis: normal Intervals: normal ST/T Wave abnormalities: normal Conduction Disutrbances: Left ventricular hypertrophy Narrative Interpretation: unremarkable except for changes consistent with LVH  ____________________________________________  RADIOLOGY  Presently no indication for CT of the head. Neurologically intact, no evidence of trauma. No headache. No neurologic complaints. History supports patient passed out prior to falling. ____________________________________________   PROCEDURES  Procedure(s) performed: None  Critical Care performed: No  ____________________________________________   INITIAL IMPRESSION / ASSESSMENT AND PLAN / ED COURSE  Pertinent labs & imaging results that were available during my care of the patient were reviewed by me and considered in my medical decision making (see chart for details).  4 episodes of syncope. No evidence of intracranial injury. Neurologically intact. No acute cardiac or pulmonary symptoms, nonetheless she was witnessed to pass out twice while seated in a wheelchair at triage. Suspect she might be significantly orthostatic, and based on her age and multiple syncopal episodes we will admit her to the hospital for observation.  EKG and labs are reassuring at this time. Again, I find no findings to suggest she needs acute CT of the head. No strokelike symptoms. No symptoms of intracranial injury. ____________________________________________   FINAL CLINICAL IMPRESSION(S) / ED DIAGNOSES  Final diagnoses:  Syncope and collapse      Delman Kitten,  MD 07/25/15 1507

## 2015-07-25 NOTE — ED Notes (Signed)
Pt to ed with c/o fall off barstool today.  Pt states she was eating breakfast and felt "funny" pt then had syncopal episode and fell off the stool.  Pt reports pain in right arm from fall.  Per husband pt went to help her up from floor and pt had another syncopal episode. Denies hitting head either time.  Pt alert and oriented at this time. No bruising or deformities noted at this time.

## 2015-07-25 NOTE — ED Notes (Signed)
Pt ambulated to toilet in room, pt denies any complaints.

## 2015-07-26 ENCOUNTER — Observation Stay
Admit: 2015-07-26 | Discharge: 2015-07-26 | Disposition: A | Discharge status: Home / Self Care | Payer: Medicare Other | Attending: Internal Medicine | Admitting: Internal Medicine

## 2015-07-26 ENCOUNTER — Observation Stay: Payer: Medicare Other

## 2015-07-26 ENCOUNTER — Observation Stay: Admit: 2015-07-26 | Payer: Medicare Other

## 2015-07-26 DIAGNOSIS — R55 Syncope and collapse: Secondary | ICD-10-CM | POA: Diagnosis not present

## 2015-07-26 LAB — TROPONIN I

## 2015-07-26 LAB — BASIC METABOLIC PANEL
ANION GAP: 5 (ref 5–15)
BUN: 10 mg/dL (ref 6–20)
CALCIUM: 8.8 mg/dL — AB (ref 8.9–10.3)
CO2: 27 mmol/L (ref 22–32)
Chloride: 108 mmol/L (ref 101–111)
Creatinine, Ser: 0.68 mg/dL (ref 0.44–1.00)
Glucose, Bld: 80 mg/dL (ref 65–99)
POTASSIUM: 3.7 mmol/L (ref 3.5–5.1)
Sodium: 140 mmol/L (ref 135–145)

## 2015-07-26 LAB — CBC
HCT: 35.4 % (ref 35.0–47.0)
HEMOGLOBIN: 11.5 g/dL — AB (ref 12.0–16.0)
MCH: 29.1 pg (ref 26.0–34.0)
MCHC: 32.4 g/dL (ref 32.0–36.0)
MCV: 89.9 fL (ref 80.0–100.0)
Platelets: 199 10*3/uL (ref 150–440)
RBC: 3.94 MIL/uL (ref 3.80–5.20)
RDW: 14.5 % (ref 11.5–14.5)
WBC: 5.6 10*3/uL (ref 3.6–11.0)

## 2015-07-26 NOTE — Progress Notes (Signed)
[facility] [place] NAME: Kelsey Morrison    MR#:  [id number]  DATE OF BIRTH:  22-Apr-1934  SUBJECTIVE: Admitted for multiple episodes of syncope yesterday. Discussed the findings with husband according to him she just passed out multiple times yesterday. No preceding symptoms. No chest pain no dizziness. No history of seizures observed. Patient did not have any arrhythmias on the monitor here . Asian denies any complaints. No history of syncope since admission. No change in her medications recently.   CHIEF COMPLAINT:   Chief Complaint  Patient presents with  . Fall    REVIEW OF SYSTEMS:    Review of Systems  Constitutional: Negative for fever and chills.  HENT: Negative for hearing loss.   Eyes: Negative for blurred vision, double vision and photophobia.  Respiratory: Negative for cough, hemoptysis and shortness of breath.   Cardiovascular: Negative for palpitations, orthopnea and leg swelling.  Gastrointestinal: Negative for vomiting, abdominal pain and diarrhea.  Genitourinary: Negative for dysuria and urgency.  Musculoskeletal: Negative for myalgias and neck pain.  Skin: Negative for rash.  Neurological: Negative for dizziness, focal weakness, seizures, weakness and headaches.  Psychiatric/Behavioral: Negative for memory loss. The patient does not have insomnia.     Nutrition: Tolerating Diet: Tolerating PT:      DRUG ALLERGIES:   Allergies  Allergen Reactions  . Celecoxib Other (See Comments)  . Hydrocodone Other (See Comments)    Altered mental status    VITALS:  Blood pressure 158/67, pulse 72, temperature 98.5 F (36.9 C), temperature source Oral, resp. rate 18, height 5\' 6"  (1.676 m), weight 59.966 kg (132 lb 3.2 oz), SpO2 95 %.  PHYSICAL EXAMINATION:   Physical Exam  GENERAL:  81 y.o.-year-old patient lying in the bed with no acute distress.  EYES: Pupils equal, round, reactive to light and  accommodation. No scleral icterus. Extraocular muscles intact.  HEENT: Head atraumatic, normocephalic. Oropharynx and nasopharynx clear.  NECK:  Supple, no jugular venous distention. No thyroid enlargement, no tenderness.  LUNGS: Normal breath sounds bilaterally, no wheezing, rales,rhonchi or crepitation. No use of accessory muscles of respiration.  CARDIOVASCULAR: S1, S2 normal. No murmurs, rubs, or gallops.  ABDOMEN: Soft, nontender, nondistended. Bowel sounds present. No organomegaly or mass.  EXTREMITIES: No pedal edema, cyanosis, or clubbing.  NEUROLOGIC: Cranial nerves II through XII are intact. Muscle strength 5/5 in all extremities. Sensation intact. Gait not checked.  PSYCHIATRIC: The patient is alert and oriented x 3.  SKIN: No obvious rash, lesion, or ulcer.    LABORATORY PANEL:   CBC  Recent Labs Lab 07/26/15 0546  WBC 5.6  HGB 11.5*  HCT 35.4  PLT 199   ------------------------------------------------------------------------------------------------------------------  Chemistries   Recent Labs Lab 07/25/15 1853 07/26/15 0546  NA  --  140  K  --  3.7  CL  --  108  CO2  --  27  GLUCOSE  --  80  BUN  --  10  CREATININE  --  0.68  CALCIUM  --  8.8*  MG 1.9  --    ------------------------------------------------------------------------------------------------------------------  Cardiac Enzymes  Recent Labs Lab 07/26/15 0546  TROPONINI <0.03   ------------------------------------------------------------------------------------------------------------------  RADIOLOGY:  No results found.   ASSESSMENT AND PLAN:   Principal Problem:   Syncope and collapse Active Problems:   HTN (hypertension)   Anxiety   Alzheimer's dementia   Inflammatory arthritis   Dry mouth   #1 syncope probably vaso-vagal. but the because of multiple  episodes evaluate further. Cardiology consult is pending patient has no arrhythmias on the monitor, so far troponins are  negative all her labs are normal. Patient may need Holter before discharge. I also want to get carotid ultrasound to evaluate for syncope. Echocardiogram as well.  #2. hypertension controlled  3. anxiety he she is on Klonopin.  History of dementia, anxiety continue mirtazapine, Zyprexa. Minimize the use of Benadryl. Avoid diuretics. All the records are reviewed and case discussed with Care Management/Social Workerr. Management plans discussed with the patient, family and they are in agreement.  CODE STATUS:  full TOTAL TIME TAKING CARE OF THIS PATIENT: 35 minutes.   POSSIBLE D/C IN 1-2 DAYS, DEPENDING ON CLINICAL CONDITION.   Epifanio Lesches M.D on 07/26/2015 at 11:51 AM  Between 7am to 6pm - Pager - [phone]  After 6pm go to [email or web address] - password [email or web address] [facility]  [facility]  Office  [phone]  CC: Primary care physician; [place] Pink, MD

## 2015-07-26 NOTE — Care Management (Signed)
Patient presents from home after a syncope episode.  Patient lives at home with her husband.  The couples son lives near by for support if needed.  Patient states that she uses [facility] on [address] to obtain her medications.  Patient has a cane at home that she uses to ambulate.  Patient states that her husband transports her when needed.  RNCM to follow for discharge planning

## 2015-07-26 NOTE — Progress Notes (Signed)
Alert and oriented with memory impairment per baseline. Sinus rhythm on tele. Patient has had no episodes of syncope or cardiac events. Vitals stable. No complaints of pain. Awaiting cardiology consult. Patient is able to ambulate with some assistance to the bedside commode, but feels she is back to her baseline. Husband is available at home to help her, which is their normal routine. Will continue to monitor.

## 2015-07-27 DIAGNOSIS — R55 Syncope and collapse: Secondary | ICD-10-CM | POA: Diagnosis not present

## 2015-07-27 NOTE — Consult Note (Signed)
Reason for Consult: syncope Referring Physician:  Dr. Kary Kos,  Dr. Colbert Ewing is an 81 y.o. female.  HPI:  81 year old female with a history of dementia Alzheimer's dry mouth hypertension inflammatory arthritis and recent episodes of syncope. Patient had an episode of syncope at home twice and then had another episode here in the hospital while in a wheelchair. The patient is not usually ambulatory and episodes usually occur with while in a seated position. To husband who witnessed these episodes stated that patient went out for several seconds before coming to again. No evidence of palpitations tachycardia or pain. No previous history of syncope until these episodes. Patient now here for further evaluation   Past Medical History  Diagnosis Date  . Anxiety   . Hypertension   . Alzheimer's dementia   . Cancer     breast cancer  . Dry mouth   . Osteoarthritis   . Depression   . HLD (hyperlipidemia)   . Inflammatory arthritis     seronegative    Past Surgical History  Procedure Laterality Date  . Abdominal hysterectomy    . Replacement total knee bilateral    . Mastectomy Right 1990  . Cholecystectomy    . Appendectomy      Family History  Problem Relation Age of Onset  . COPD Sister   . Breast cancer Sister   . Breast cancer Mother     Social History:  reports that she has never smoked. She does not have any smokeless tobacco history on file. She reports that she does not drink alcohol or use illicit drugs.  Allergies:  Allergies  Allergen Reactions  . Celecoxib Other (See Comments)  . Hydrocodone Other (See Comments)    Altered mental status    Medications: I have reviewed the patient's current medications.  Results for orders placed or performed during the hospital encounter of 07/25/15 (from the past 48 hour(s))  Urinalysis complete, with microscopic ([facility] only)     Status: Abnormal   Collection Time: 07/25/15  1:13 PM  Result Value Ref Range    Color, Urine STRAW (A) YELLOW   APPearance CLEAR (A) CLEAR   Glucose, UA NEGATIVE NEGATIVE mg/dL   Bilirubin Urine NEGATIVE NEGATIVE   Ketones, ur NEGATIVE NEGATIVE mg/dL   Specific Gravity, Urine 1.005 1.005 - 1.030   Hgb urine dipstick 2+ (A) NEGATIVE   pH 7.0 5.0 - 8.0   Protein, ur NEGATIVE NEGATIVE mg/dL   Nitrite NEGATIVE NEGATIVE   Leukocytes, UA TRACE (A) NEGATIVE   RBC / HPF 0-5 0 - 5 RBC/hpf   WBC, UA 0-5 0 - 5 WBC/hpf   Bacteria, UA NONE SEEN NONE SEEN   Squamous Epithelial / LPF 0-5 (A) NONE SEEN   Hyaline Casts, UA PRESENT   Magnesium     Status: None   Collection Time: 07/25/15  6:53 PM  Result Value Ref Range   Magnesium 1.9 1.7 - 2.4 mg/dL  Phosphorus     Status: None   Collection Time: 07/25/15  6:53 PM  Result Value Ref Range   Phosphorus 3.6 2.5 - 4.6 mg/dL  TSH     Status: None   Collection Time: 07/25/15  6:53 PM  Result Value Ref Range   TSH 1.120 0.350 - 4.500 uIU/mL  Troponin I     Status: None   Collection Time: 07/25/15  6:53 PM  Result Value Ref Range   Troponin I <0.03 <0.031 ng/mL    Comment:  NO INDICATION OF MYOCARDIAL INJURY.   Troponin I     Status: None   Collection Time: 07/26/15 12:26 AM  Result Value Ref Range   Troponin I <0.03 <0.031 ng/mL    Comment:        NO INDICATION OF MYOCARDIAL INJURY.   Troponin I     Status: None   Collection Time: 07/26/15  5:46 AM  Result Value Ref Range   Troponin I <0.03 <0.031 ng/mL    Comment:        NO INDICATION OF MYOCARDIAL INJURY.   Basic metabolic panel     Status: Abnormal   Collection Time: 07/26/15  5:46 AM  Result Value Ref Range   Sodium 140 135 - 145 mmol/L   Potassium 3.7 3.5 - 5.1 mmol/L   Chloride 108 101 - 111 mmol/L   CO2 27 22 - 32 mmol/L   Glucose, Bld 80 65 - 99 mg/dL   BUN 10 6 - 20 mg/dL   Creatinine, Ser 0.68 0.44 - 1.00 mg/dL   Calcium 8.8 (L) 8.9 - 10.3 mg/dL   GFR calc non Af Amer >60 >60 mL/min   GFR calc Af Amer >60 >60 mL/min    Comment:  (NOTE) The eGFR has been calculated using the CKD EPI equation. This calculation has not been validated in all clinical situations. eGFR's persistently <60 mL/min signify possible Chronic Kidney Disease.    Anion gap 5 5 - 15  CBC     Status: Abnormal   Collection Time: 07/26/15  5:46 AM  Result Value Ref Range   WBC 5.6 3.6 - 11.0 K/uL   RBC 3.94 3.80 - 5.20 MIL/uL   Hemoglobin 11.5 (L) 12.0 - 16.0 g/dL   HCT 35.4 35.0 - 47.0 %   MCV 89.9 80.0 - 100.0 fL   MCH 29.1 26.0 - 34.0 pg   MCHC 32.4 32.0 - 36.0 g/dL   RDW 14.5 11.5 - 14.5 %   Platelets 199 150 - 440 K/uL    US Carotid Bilateral  07/26/2015   CLINICAL DATA:  Syncope  EXAM: BILATERAL CAROTID DUPLEX ULTRASOUND  TECHNIQUE: Pearline Cables scale imaging, color Doppler and duplex ultrasound was performed of bilateral carotid and vertebral arteries in the neck.  COMPARISON:  None.  REVIEW OF SYSTEMS: Quantification of carotid stenosis is based on velocity parameters that correlate the residual internal carotid diameter with NASCET-based stenosis levels, using the diameter of the distal internal carotid lumen as the denominator for stenosis measurement.  The following velocity measurements were obtained:  PEAK SYSTOLIC/END DIASTOLIC  RIGHT  ICA:                     126/21cm/sec  CCA:                     02/72ZD/GUY  SYSTOLIC ICA/CCA RATIO:  1.4  DIASTOLIC ICA/CCA RATIO: 1.2  ECA:                     145cm/sec  LEFT  ICA:                     84/24cm/sec  CCA:                     40/34VQ/QVZ  SYSTOLIC ICA/CCA RATIO:  0.9  DIASTOLIC ICA/CCA RATIO: 1.9  ECA:  147cm/sec  FINDINGS: RIGHT CAROTID ARTERY: Calcified nonocclusive plaque in the mid and distal common carotid artery, bulb, and proximal ICA. Normal waveforms and color Doppler signal. No high-grade stenosis.  RIGHT VERTEBRAL ARTERY:  Normal flow direction and waveform.  LEFT CAROTID ARTERY: Eccentric partially calcified plaque in the carotid bulb and proximal ICA. No high-grade  stenosis. Normal waveforms and color Doppler signal. The distal ICA is tortuous.  LEFT VERTEBRAL ARTERY: Normal flow direction and waveform.  IMPRESSION: 1. Bilateral carotid bifurcation and proximal ICA plaque resulting in less than 50% diameter stenosis. The exam does not exclude plaque ulceration or embolization. Continued surveillance recommended.   Electronically Signed   By: Lucrezia Europe M.D.   On: 07/26/2015 17:07    Review of Systems  Constitutional: Positive for malaise/fatigue.  Eyes: Negative.   Respiratory: Negative.   Cardiovascular: Negative.   Gastrointestinal: Negative.   Genitourinary: Negative.   Musculoskeletal: Negative.   Skin: Negative.   Neurological: Positive for sensory change and loss of consciousness.  Endo/Heme/Allergies: Negative.   Psychiatric/Behavioral: Positive for depression and memory loss. The patient is nervous/anxious.    Blood pressure 163/70, pulse 66, temperature 98.2 F (36.8 C), temperature source Oral, resp. rate 20, height 5' 6"  (1.676 m), weight 59.966 kg (132 lb 3.2 oz), SpO2 96 %. Physical Exam  Constitutional: She appears well-developed and well-nourished. She appears listless.  HENT:  Head: Normocephalic and atraumatic.  Right Ear: External ear normal.  Eyes: Conjunctivae and EOM are normal. Pupils are equal, round, and reactive to light.  Neck: Normal range of motion. Neck supple.  Cardiovascular: Normal rate, regular rhythm and normal heart sounds.   Respiratory: Effort normal and breath sounds normal.  GI: Soft. Bowel sounds are normal.  Musculoskeletal: Normal range of motion.  Neurological: She has normal strength. She appears listless. No cranial nerve deficit or sensory deficit. Coordination and gait abnormal.  Skin: Skin is warm and dry.  Psychiatric: Her speech is delayed. She is slowed and withdrawn. Cognition and memory are impaired. She exhibits a depressed mood. She exhibits abnormal recent memory and abnormal remote memory.     Assessment/Plan:  syncope  blackout spells  Alzheimer's dementia  hypertension  inflammatory arthritis  dry mouth  anxiety . PLAN  agree with telemetry  agree with rule out for myocardial infarction  continue hypertension control with amlodipine  continue prednisone therapy for inflammatory arthritis  continue current therapy for anxiety  unremarkable carotid Dopplers  echocardiogram also unremarkable at this point  continue melatonin for insomnia  follow-up with Neurology of psychiatry for Alzheimer's dementia  recommend Holter monitor could possibly be done as an outpatient  outpatient follow-up with Cardiology   continue Remeron Klonopin Namenda  agree with outpatient follow-up at this point  CALLWOOD,DWAYNE D. 07/27/2015, 12:46 PM

## 2015-07-27 NOTE — Progress Notes (Signed)
Pt in NAd, skin warm and dry, VSS, SR per monitor.  IV and telemetry discontinued per policy and procedure.  Discharge instructions given to and reviewed with patient.   Pt discharged home.

## 2015-07-28 NOTE — Discharge Summary (Signed)
Kelsey Morrison, is a 81 y.o. female  DOB [birth year]  MRN [id number].  Admission date:  07/25/2015  Admitting Physician  Lance Coon, MD  Discharge Date:  07/27/2015   Primary MD  Maryland Pink, MD  Recommendations for primary care physician for things to follow:   Follow-up with Dr. Donney Dice regarding Holter results.   Admission Diagnosis  Syncope and collapse [R55]   Discharge Diagnosis  Syncope and collapse [R55]    Principal Problem:   Syncope and collapse Active Problems:   HTN (hypertension)   Anxiety   Alzheimer's dementia   Inflammatory arthritis   Dry mouth      Past Medical History  Diagnosis Date  . Anxiety   . Hypertension   . Alzheimer's dementia   . Cancer     breast cancer  . Dry mouth   . Osteoarthritis   . Depression   . HLD (hyperlipidemia)   . Inflammatory arthritis     seronegative    Past Surgical History  Procedure Laterality Date  . Abdominal hysterectomy    . Replacement total knee bilateral    . Mastectomy Right 1990  . Cholecystectomy    . Appendectomy         History of present illness and  Hospital Course:     Kindly see H&P for history of present illness and admission details, please review complete Labs, Consult reports and Test reports for all details in brief  HPI  from the history and physical done on the day of admission  81 year old female patient admitted to hospitalist service secondary to multiple episodes of syncope. Patient does have Alzheimer's dementia admitted to telemetry for syncope evaluation and follow 11th.  Hospital Course  #1 syncope likely due to medications: Patient telemetry did not show any arrhythmias. Troponins have been negative. Carotid ultrasound did not show any hemodynamically significant stenosis. Did not have any orthostatic  hypotension. Patient echo showed EF of more than 55%. Seen by Dr. Clayborn Bigness  from cardiology we arranged for outpatient Holter.  #2 hypertension controlled #3 Alzheimer's dementia and depression: Takes clonazepam, mirtazapine, Risperdal.  findings discussed with the husband. Discharge Condition:  Follow UP  Follow-up Information    Follow up with Yolonda Kida., MD. Go on 08/04/2015.   Specialties:  Cardiology, Internal Medicine   Why:  at 11:45am    Contact information:   [place] [address] [phone]       Follow up with Maryland Pink, MD. Go on 08/08/2015.   Specialty:  Family Medicine   Why:  at 9:45am.    Contact information:   [place] [address] [phone]         Discharge Instructions  and  Discharge Medications       Medication List    TAKE these medications        amLODipine 5 MG tablet  Commonly known as:  NORVASC  Take 5 mg by mouth daily.     antiseptic oral rinse Liqd  15 mLs by Mouth Rinse route at bedtime.     aspirin EC 81 MG tablet  Take 81 mg by mouth at bedtime.     calcium citrate 950 MG tablet  Commonly known as:  CALCITRATE - dosed in mg elemental calcium  Take 200 mg of elemental calcium by mouth daily.     clonazePAM 0.5 MG tablet  Commonly known as:  KLONOPIN  Take 0.5 mg by mouth at bedtime.  clotrimazole 10 MG troche  Commonly known as:  MYCELEX  Take 10 mg by mouth 3 (three) times daily.     Melatonin 5 MG Tabs  Take 1 tablet by mouth at bedtime.     memantine 10 MG tablet  Commonly known as:  NAMENDA  Take 10 mg by mouth 2 (two) times daily.     mirtazapine 7.5 MG tablet  Commonly known as:  REMERON  Take 7.5 mg by mouth at bedtime.     OLANZapine 2.5 MG tablet  Commonly known as:  ZYPREXA  Take 1.25 mg by mouth 2 (two) times daily.     predniSONE 5 MG tablet  Commonly known as:  DELTASONE  Take 5 mg by mouth daily with breakfast.          Diet and  Activity recommendation: See Discharge Instructions above   Consults obtained -cardiology   Major procedures and Radiology Reports - PLEASE review detailed and final reports for all details, in brief -     US Carotid Bilateral  07/26/2015   CLINICAL DATA:  Syncope  EXAM: BILATERAL CAROTID DUPLEX ULTRASOUND  TECHNIQUE: Pearline Cables scale imaging, color Doppler and duplex ultrasound was performed of bilateral carotid and vertebral arteries in the neck.  COMPARISON:  None.  REVIEW OF SYSTEMS: Quantification of carotid stenosis is based on velocity parameters that correlate the residual internal carotid diameter with NASCET-based stenosis levels, using the diameter of the distal internal carotid lumen as the denominator for stenosis measurement.  The following velocity measurements were obtained:  PEAK SYSTOLIC/END DIASTOLIC  RIGHT  ICA:                     126/21cm/sec  CCA:                     11/94RD/EYC  SYSTOLIC ICA/CCA RATIO:  1.4  DIASTOLIC ICA/CCA RATIO: 1.2  ECA:                     145cm/sec  LEFT  ICA:                     84/24cm/sec  CCA:                     14/48JE/HUD  SYSTOLIC ICA/CCA RATIO:  0.9  DIASTOLIC ICA/CCA RATIO: 1.9  ECA:                     147cm/sec  FINDINGS: RIGHT CAROTID ARTERY: Calcified nonocclusive plaque in the mid and distal common carotid artery, bulb, and proximal ICA. Normal waveforms and color Doppler signal. No high-grade stenosis.  RIGHT VERTEBRAL ARTERY:  Normal flow direction and waveform.  LEFT CAROTID ARTERY: Eccentric partially calcified plaque in the carotid bulb and proximal ICA. No high-grade stenosis. Normal waveforms and color Doppler signal. The distal ICA is tortuous.  LEFT VERTEBRAL ARTERY: Normal flow direction and waveform.  IMPRESSION: 1. Bilateral carotid bifurcation and proximal ICA plaque resulting in less than 50% diameter stenosis. The exam does not exclude plaque ulceration or embolization. Continued surveillance recommended.   Electronically Signed   By:  Lucrezia Europe M.D.   On: 07/26/2015 17:07    Micro Results    No results found for this or any previous visit (from the past 240 hour(s)).     Today   Subjective:   Kelsey Morrison today has no headache,no chest abdominal pain,no new weakness tingling or  numbness, feels much better wants to go home today.  Objective:   Blood pressure 163/70, pulse 66, temperature 98.2 F (36.8 C), temperature source Oral, resp. rate 20, height 5\' 6"  (1.676 m), weight 59.966 kg (132 lb 3.2 oz), SpO2 96 %.  No intake or output data in the 24 hours ending 07/28/15 1259  Exam Awake Alert, Oriented x 3, No new F.N deficits, Normal affect [place].AT,PERRAL Supple Neck,No JVD, No cervical lymphadenopathy appriciated.  Symmetrical Chest wall movement, Good air movement bilaterally, CTAB RRR,No Gallops,Rubs or new Murmurs, No Parasternal Heave +ve B.Sounds, Abd Soft, Non tender, No organomegaly appriciated, No rebound -guarding or rigidity. No Cyanosis, Clubbing or edema, No new Rash or bruise  Data Review   CBC w Diff:  Lab Results  Component Value Date   WBC 5.6 07/26/2015   WBC 5.4 02/15/2014   HGB 11.5* 07/26/2015   HGB 9.5* 02/15/2014   HCT 35.4 07/26/2015   HCT 29.1* 02/15/2014   PLT 199 07/26/2015   PLT 274 02/15/2014   LYMPHOPCT 16.8 02/15/2014   MONOPCT 13.4 02/15/2014   EOSPCT 0.7 02/15/2014   BASOPCT 0.8 02/15/2014    CMP:  Lab Results  Component Value Date   NA 140 07/26/2015   NA 134* 02/15/2014   K 3.7 07/26/2015   K 4.1 02/15/2014   CL 108 07/26/2015   CL 104 02/15/2014   CO2 27 07/26/2015   CO2 27 02/15/2014   BUN 10 07/26/2015   BUN 13 02/15/2014   CREATININE 0.68 07/26/2015   CREATININE 0.50* 02/15/2014   PROT 6.1* 02/14/2014   ALBUMIN 2.4* 02/14/2014   BILITOT 0.2 02/14/2014   ALKPHOS 78 02/14/2014   AST 89* 02/14/2014   ALT 30 02/14/2014  .   Total Time in preparing paper work, data evaluation and todays exam - 4 minutes  Maliyah Willets M.D on  07/27/2015 at 12:59 PM

## 2017-04-19 ENCOUNTER — Emergency Department
Admission: EM | Admit: 2017-04-19 | Discharge: 2017-04-19 | Disposition: A | Discharge status: Home / Self Care | Payer: Medicare Other | Attending: Emergency Medicine | Admitting: Emergency Medicine

## 2017-04-19 ENCOUNTER — Encounter: Payer: Self-pay | Admitting: Emergency Medicine

## 2017-04-19 DIAGNOSIS — N390 Urinary tract infection, site not specified: Secondary | ICD-10-CM

## 2017-04-19 DIAGNOSIS — Z7982 Long term (current) use of aspirin: Secondary | ICD-10-CM | POA: Insufficient documentation

## 2017-04-19 DIAGNOSIS — I10 Essential (primary) hypertension: Secondary | ICD-10-CM | POA: Diagnosis not present

## 2017-04-19 DIAGNOSIS — Z79899 Other long term (current) drug therapy: Secondary | ICD-10-CM | POA: Diagnosis not present

## 2017-04-19 DIAGNOSIS — F0280 Dementia in other diseases classified elsewhere without behavioral disturbance: Secondary | ICD-10-CM | POA: Insufficient documentation

## 2017-04-19 DIAGNOSIS — G309 Alzheimer's disease, unspecified: Secondary | ICD-10-CM | POA: Insufficient documentation

## 2017-04-19 DIAGNOSIS — R300 Dysuria: Secondary | ICD-10-CM | POA: Diagnosis present

## 2017-04-19 DIAGNOSIS — G308 Other Alzheimer's disease: Secondary | ICD-10-CM | POA: Insufficient documentation

## 2017-04-19 DIAGNOSIS — Z853 Personal history of malignant neoplasm of breast: Secondary | ICD-10-CM | POA: Diagnosis not present

## 2017-04-19 LAB — URINALYSIS, COMPLETE (UACMP) WITH MICROSCOPIC: SPECIFIC GRAVITY, URINE: 1.022 (ref 1.005–1.030)

## 2017-04-19 MED ORDER — PHENAZOPYRIDINE HCL 100 MG PO TABS: 100.0000 mg | ORAL_TABLET | Freq: Three times a day (TID) | ORAL | 0 refills | Status: AC | PRN

## 2017-04-19 MED ORDER — CEPHALEXIN 500 MG PO CAPS: 500.0000 mg | ORAL_CAPSULE | Freq: Two times a day (BID) | ORAL | 0 refills | Status: AC

## 2017-04-19 NOTE — ED Triage Notes (Signed)
Patient presents to the ED with burning with urination that began today and urinary frequency that began yesterday.  Patient is in no obvious distress at this time.

## 2017-04-19 NOTE — ED Provider Notes (Signed)
[facility] Emergency Department Provider Note  ____________________________________________  Time seen: Approximately 2:28 PM  I have reviewed the triage vital signs and the nursing notes.   HISTORY  Chief Complaint Dysuria    HPI Kelsey Morrison is a 83 y.o. female that presents emergency department with burning, urgency, frequency of urination for 2 days. Patient states that she's got several UTIs in the past and this feels the same. She is not sure what antibiotics she has taken before but does not believe she is allergic to anything. She took 2 pills of AZO this morning. She denies fever, shortness of breath, chest pain, nausea, vomiting, abdominal pain, back pain.   Past Medical History:  Diagnosis Date  . Alzheimer's dementia   . Anxiety   . Cancer [facility])    breast cancer  . Depression   . Dry mouth   . HLD (hyperlipidemia)   . Hypertension   . Inflammatory arthritis    seronegative  . Osteoarthritis     Patient Active Problem List   Diagnosis Date Noted  . Syncope and collapse 07/25/2015  . HTN (hypertension) 07/25/2015  . Anxiety 07/25/2015  . Alzheimer's dementia 07/25/2015  . Inflammatory arthritis 07/25/2015  . Dry mouth 07/25/2015    Past Surgical History:  Procedure Laterality Date  . ABDOMINAL HYSTERECTOMY    . APPENDECTOMY    . CHOLECYSTECTOMY    . MASTECTOMY Right 1990  . REPLACEMENT TOTAL KNEE BILATERAL      Prior to Admission medications   Medication Sig Start Date End Date Taking? Authorizing Provider  amLODipine (NORVASC) 5 MG tablet Take 5 mg by mouth daily.    [provider]  antiseptic oral rinse (BIOTENE) LIQD 15 mLs by Mouth Rinse route at bedtime.    [provider]  aspirin EC 81 MG tablet Take 81 mg by mouth at bedtime.    [provider]  calcium citrate (CALCITRATE - DOSED IN MG ELEMENTAL CALCIUM) 950 MG tablet Take 200 mg of elemental calcium by mouth daily.    [provider]  cephALEXin (KEFLEX) 500 MG capsule Take 1 capsule (500 mg total) by mouth 2 (two) times daily. 04/19/17 04/29/17  Laban Emperor, PA-C  clonazePAM (KLONOPIN) 0.5 MG tablet Take 0.5 mg by mouth at bedtime.    [provider]  clotrimazole (MYCELEX) 10 MG troche Take 10 mg by mouth 3 (three) times daily.    [provider]  Melatonin 5 MG TABS Take 1 tablet by mouth at bedtime.    [provider]  memantine (NAMENDA) 10 MG tablet Take 10 mg by mouth 2 (two) times daily.    [provider]  mirtazapine (REMERON) 7.5 MG tablet Take 7.5 mg by mouth at bedtime.    [provider]  OLANZapine (ZYPREXA) 2.5 MG tablet Take 1.25 mg by mouth 2 (two) times daily.    [provider]  phenazopyridine (PYRIDIUM) 100 MG tablet Take 1 tablet (100 mg total) by mouth 3 (three) times daily as needed for pain. 04/19/17 04/21/17  Laban Emperor, PA-C  predniSONE (DELTASONE) 5 MG tablet Take 5 mg by mouth daily with breakfast.    [provider]    Allergies Celecoxib and Hydrocodone  Family History  Problem Relation Age of Onset  . Breast cancer Mother   . COPD Sister   . Breast cancer Sister     Social History Social History  Substance Use Topics  . Smoking status: Never Smoker  .  Smokeless tobacco: Never Used  . Alcohol use No     Review of Systems  Constitutional: No fever/chills Cardiovascular: No chest pain. Respiratory: No SOB. Gastrointestinal: No abdominal pain.  No nausea, no vomiting.  Genitourinary: Positive for dysuria, urgency, frequency. Musculoskeletal: Negative for musculoskeletal pain. Skin: Negative for rash, abrasions, lacerations, ecchymosis. Neurological: Negative for headaches   ____________________________________________   PHYSICAL EXAM:  VITAL SIGNS: ED Triage Vitals  Enc Vitals Group     BP 04/19/17 1349 (!) 156/75     Pulse Rate 04/19/17 1349 79     Resp 04/19/17 1349 18     Temp  04/19/17 1349 98.7 F (37.1 C)     Temp Source 04/19/17 1349 Oral     SpO2 04/19/17 1349 97 %     Weight 04/19/17 1349 149 lb (67.6 kg)     Height 04/19/17 1349 5\' 6"  (1.676 m)     Head Circumference --      Peak Flow --      Pain Score 04/19/17 1348 5     Pain Loc --      Pain Edu? --      Excl. in [place]? --      Constitutional: Alert and oriented. Well appearing and in no acute distress. Eyes: Conjunctivae are normal. PERRL. EOMI. Head: Atraumatic. ENT:      Ears:      Nose: No congestion/rhinnorhea.      Mouth/Throat: Mucous membranes are moist.  Neck: No stridor.  Cardiovascular: Normal rate, regular rhythm.  Good peripheral circulation. Respiratory: Normal respiratory effort without tachypnea or retractions. Lungs CTAB. Good air entry to the bases with no decreased or absent breath sounds. Gastrointestinal: Bowel sounds 4 quadrants. Soft and nontender to palpation. No guarding or rigidity. No palpable masses. No distention. No CVA tenderness. Musculoskeletal: Full range of motion to all extremities. No gross deformities appreciated. Neurologic:  Normal speech and language. No gross focal neurologic deficits are appreciated.  Skin:  Skin is warm, dry and intact. No rash noted.    ____________________________________________   LABS (all labs ordered are listed, but only abnormal results are displayed)  Labs Reviewed  URINALYSIS, COMPLETE (UACMP) WITH MICROSCOPIC - Abnormal; Notable for the following:       Result Value   Color, Urine ORANGE (*)    Glucose, UA   (*)    Value: TEST NOT REPORTED DUE TO COLOR INTERFERENCE OF URINE PIGMENT   Hgb urine dipstick   (*)    Value: TEST NOT REPORTED DUE TO COLOR INTERFERENCE OF URINE PIGMENT   Bilirubin Urine   (*)    Value: TEST NOT REPORTED DUE TO COLOR INTERFERENCE OF URINE PIGMENT   Ketones, ur   (*)    Value: TEST NOT REPORTED DUE TO COLOR INTERFERENCE OF URINE PIGMENT   Protein, ur   (*)    Value: TEST NOT REPORTED DUE  TO COLOR INTERFERENCE OF URINE PIGMENT   Nitrite   (*)    Value: TEST NOT REPORTED DUE TO COLOR INTERFERENCE OF URINE PIGMENT   Leukocytes, UA   (*)    Value: TEST NOT REPORTED DUE TO COLOR INTERFERENCE OF URINE PIGMENT   Bacteria, UA RARE (*)    Squamous Epithelial / LPF 0-5 (*)    All other components within normal limits   ____________________________________________  EKG   ____________________________________________  RADIOLOGY   No results found.  ____________________________________________    PROCEDURES  Procedure(s) performed:    Procedures    Medications -  No data to display   ____________________________________________   INITIAL IMPRESSION / ASSESSMENT AND PLAN / ED COURSE  Pertinent labs & imaging results that were available during my care of the patient were reviewed by me and considered in my medical decision making (see chart for details).  Review of the [place] CSRS was performed in accordance of the [place] prior to dispensing any controlled drugs.   Patient's diagnosis is consistent with urinary tract infection. Vital signs and exam are reassuring. Urinalysis inaccurate from [facility] so patient will be treated based on symptoms. She denies any abdominal pain or back pain. No systemic symptoms. I will not treat with Bactrim since patient is allergic to Celebrex. Patient will be discharged home with prescriptions for keflex. Patient is to follow up with PCP as directed. Patient is given ED precautions to return to the ED for any worsening or new symptoms.     ____________________________________________  FINAL CLINICAL IMPRESSION(S) / ED DIAGNOSES  Final diagnoses:  Lower urinary tract infectious disease      NEW MEDICATIONS STARTED DURING THIS VISIT:  Discharge Medication List as of 04/19/2017  3:08 PM    START taking these medications   Details  cephALEXin (KEFLEX) 500 MG capsule Take 1 capsule (500 mg total) by mouth 2 (two) times daily.,  Starting Sat 04/19/2017, Until Tue 04/29/2017, Print    phenazopyridine (PYRIDIUM) 100 MG tablet Take 1 tablet (100 mg total) by mouth 3 (three) times daily as needed for pain., Starting Sat 04/19/2017, Until Mon 04/21/2017, Print            This chart was dictated using voice recognition software/Dragon. Despite best efforts to proofread, errors can occur which can change the meaning. Any change was purely unintentional.    Laban Emperor, PA-C 04/19/17 1529    Delman Kitten, MD 04/19/17 1610

## 2017-05-18 ENCOUNTER — Emergency Department: Payer: Medicare Other

## 2017-05-18 ENCOUNTER — Inpatient Hospital Stay
Admission: EM | Admit: 2017-05-18 | Discharge: 2017-05-20 | DRG: 872 | Disposition: A | Discharge status: Home Health | Payer: Medicare Other | Attending: Internal Medicine | Admitting: Internal Medicine

## 2017-05-18 DIAGNOSIS — E785 Hyperlipidemia, unspecified: Secondary | ICD-10-CM | POA: Diagnosis present

## 2017-05-18 DIAGNOSIS — F419 Anxiety disorder, unspecified: Secondary | ICD-10-CM | POA: Diagnosis present

## 2017-05-18 DIAGNOSIS — I248 Other forms of acute ischemic heart disease: Secondary | ICD-10-CM | POA: Diagnosis present

## 2017-05-18 DIAGNOSIS — Z792 Long term (current) use of antibiotics: Secondary | ICD-10-CM

## 2017-05-18 DIAGNOSIS — E860 Dehydration: Secondary | ICD-10-CM | POA: Diagnosis present

## 2017-05-18 DIAGNOSIS — F0280 Dementia in other diseases classified elsewhere without behavioral disturbance: Secondary | ICD-10-CM | POA: Diagnosis present

## 2017-05-18 DIAGNOSIS — I10 Essential (primary) hypertension: Secondary | ICD-10-CM | POA: Diagnosis present

## 2017-05-18 DIAGNOSIS — N390 Urinary tract infection, site not specified: Secondary | ICD-10-CM | POA: Diagnosis present

## 2017-05-18 DIAGNOSIS — Z7982 Long term (current) use of aspirin: Secondary | ICD-10-CM | POA: Diagnosis not present

## 2017-05-18 DIAGNOSIS — N179 Acute kidney failure, unspecified: Secondary | ICD-10-CM | POA: Diagnosis present

## 2017-05-18 DIAGNOSIS — R7989 Other specified abnormal findings of blood chemistry: Secondary | ICD-10-CM

## 2017-05-18 DIAGNOSIS — B9620 Unspecified Escherichia coli [E. coli] as the cause of diseases classified elsewhere: Secondary | ICD-10-CM | POA: Diagnosis present

## 2017-05-18 DIAGNOSIS — F329 Major depressive disorder, single episode, unspecified: Secondary | ICD-10-CM | POA: Diagnosis present

## 2017-05-18 DIAGNOSIS — Z79899 Other long term (current) drug therapy: Secondary | ICD-10-CM | POA: Diagnosis not present

## 2017-05-18 DIAGNOSIS — E876 Hypokalemia: Secondary | ICD-10-CM | POA: Diagnosis present

## 2017-05-18 DIAGNOSIS — Z7952 Long term (current) use of systemic steroids: Secondary | ICD-10-CM

## 2017-05-18 DIAGNOSIS — Z853 Personal history of malignant neoplasm of breast: Secondary | ICD-10-CM | POA: Diagnosis not present

## 2017-05-18 DIAGNOSIS — A419 Sepsis, unspecified organism: Secondary | ICD-10-CM | POA: Diagnosis not present

## 2017-05-18 DIAGNOSIS — G309 Alzheimer's disease, unspecified: Secondary | ICD-10-CM | POA: Diagnosis present

## 2017-05-18 DIAGNOSIS — R778 Other specified abnormalities of plasma proteins: Secondary | ICD-10-CM

## 2017-05-18 LAB — URINALYSIS, COMPLETE (UACMP) WITH MICROSCOPIC
Bilirubin Urine: NEGATIVE
GLUCOSE, UA: NEGATIVE mg/dL
Ketones, ur: NEGATIVE mg/dL
Nitrite: NEGATIVE
Protein, ur: 100 mg/dL — AB
SPECIFIC GRAVITY, URINE: 1.017 (ref 1.005–1.030)
Squamous Epithelial / LPF: NONE SEEN
pH: 5 (ref 5.0–8.0)

## 2017-05-18 LAB — BASIC METABOLIC PANEL
ANION GAP: 11 (ref 5–15)
BUN: 35 mg/dL — ABNORMAL HIGH (ref 6–20)
CHLORIDE: 100 mmol/L — AB (ref 101–111)
CO2: 24 mmol/L (ref 22–32)
Calcium: 8.9 mg/dL (ref 8.9–10.3)
Creatinine, Ser: 1.64 mg/dL — ABNORMAL HIGH (ref 0.44–1.00)
GFR calc Af Amer: 33 mL/min — ABNORMAL LOW (ref >60)
GFR calc non Af Amer: 28 mL/min — ABNORMAL LOW (ref >60)
GLUCOSE: 111 mg/dL — AB (ref 65–99)
POTASSIUM: 3.4 mmol/L — AB (ref 3.5–5.1)
Sodium: 135 mmol/L (ref 135–145)

## 2017-05-18 LAB — CBC WITH DIFFERENTIAL/PLATELET
Basophils Absolute: 0 10*3/uL (ref 0–0.1)
Basophils Relative: 0 %
EOS ABS: 0 10*3/uL (ref 0–0.7)
EOS PCT: 0 %
HCT: 35.6 % (ref 35.0–47.0)
HEMOGLOBIN: 12.1 g/dL (ref 12.0–16.0)
LYMPHS ABS: 0.8 10*3/uL — AB (ref 1.0–3.6)
LYMPHS PCT: 9 %
MCH: 30.5 pg (ref 26.0–34.0)
MCHC: 34.1 g/dL (ref 32.0–36.0)
MCV: 89.6 fL (ref 80.0–100.0)
MONOS PCT: 6 %
Monocytes Absolute: 0.5 10*3/uL (ref 0.2–0.9)
Neutro Abs: 7.3 10*3/uL — ABNORMAL HIGH (ref 1.4–6.5)
Neutrophils Relative %: 85 %
Platelets: 202 10*3/uL (ref 150–440)
RBC: 3.97 MIL/uL (ref 3.80–5.20)
RDW: 14.1 % (ref 11.5–14.5)
WBC: 8.7 10*3/uL (ref 3.6–11.0)

## 2017-05-18 LAB — TROPONIN I
Troponin I: 0.13 ng/mL (ref <0.03)
Troponin I: 0.1 ng/mL (ref <0.03)
Troponin I: 0.08 ng/mL (ref <0.03)

## 2017-05-18 LAB — HEPATIC FUNCTION PANEL
ALT: 30 U/L (ref 14–54)
AST: 67 U/L — AB (ref 15–41)
Albumin: 3 g/dL — ABNORMAL LOW (ref 3.5–5.0)
Alkaline Phosphatase: 237 U/L — ABNORMAL HIGH (ref 38–126)
BILIRUBIN DIRECT: 0.7 mg/dL — AB (ref 0.1–0.5)
BILIRUBIN TOTAL: 1.2 mg/dL (ref 0.3–1.2)
Indirect Bilirubin: 0.5 mg/dL (ref 0.3–0.9)
Total Protein: 7.2 g/dL (ref 6.5–8.1)

## 2017-05-18 MED ORDER — MEMANTINE HCL 5 MG PO TABS
10.0000 mg | ORAL_TABLET | Freq: Two times a day (BID) | ORAL | Status: DC
  Administered 2017-05-18 – 2017-05-20 (×5): 10 mg via ORAL
  Filled 2017-05-18 (×5): qty 2

## 2017-05-18 MED ORDER — DEXTROSE 5 % IV SOLN
1.0000 g | Freq: Once | INTRAVENOUS | Status: AC
  Administered 2017-05-18: 1 g via INTRAVENOUS
  Filled 2017-05-18: qty 10

## 2017-05-18 MED ORDER — BIOTENE DRY MOUTH MT LIQD
15.0000 mL | Freq: Every day | OROMUCOSAL | Status: DC
  Administered 2017-05-18 – 2017-05-19 (×2): 15 mL via OROMUCOSAL

## 2017-05-18 MED ORDER — CALCIUM CARBONATE ANTACID 500 MG PO CHEW
200.0000 mg | CHEWABLE_TABLET | Freq: Every day | ORAL | Status: DC
  Administered 2017-05-18 – 2017-05-20 (×3): 200 mg via ORAL
  Filled 2017-05-18 (×3): qty 1

## 2017-05-18 MED ORDER — DEXTROSE 5 % IV SOLN
1.0000 g | INTRAVENOUS | Status: DC
  Administered 2017-05-19: 1 g via INTRAVENOUS
  Filled 2017-05-18 (×2): qty 10

## 2017-05-18 MED ORDER — MELATONIN 5 MG PO TABS
1.0000 | ORAL_TABLET | Freq: Every day | ORAL | Status: DC
  Administered 2017-05-18 – 2017-05-19 (×2): 5 mg via ORAL
  Filled 2017-05-18 (×3): qty 1

## 2017-05-18 MED ORDER — SODIUM CHLORIDE 0.9 % IV SOLN
INTRAVENOUS | Status: DC
  Administered 2017-05-18 – 2017-05-19 (×2): via INTRAVENOUS

## 2017-05-18 MED ORDER — OLANZAPINE 2.5 MG PO TABS
1.2500 mg | ORAL_TABLET | Freq: Two times a day (BID) | ORAL | Status: DC
  Administered 2017-05-18 – 2017-05-20 (×5): 1.25 mg via ORAL
  Filled 2017-05-18 (×3): qty 0.5
  Filled 2017-05-18 (×2): qty 1
  Filled 2017-05-18: qty 0.5

## 2017-05-18 MED ORDER — PREDNISONE 5 MG PO TABS
5.0000 mg | ORAL_TABLET | Freq: Every day | ORAL | Status: DC
  Administered 2017-05-19 – 2017-05-20 (×2): 5 mg via ORAL
  Filled 2017-05-18 (×2): qty 1

## 2017-05-18 MED ORDER — FAMOTIDINE 20 MG PO TABS
10.0000 mg | ORAL_TABLET | Freq: Two times a day (BID) | ORAL | Status: DC
  Administered 2017-05-18 – 2017-05-20 (×5): 10 mg via ORAL
  Filled 2017-05-18 (×5): qty 1

## 2017-05-18 MED ORDER — MIRTAZAPINE 15 MG PO TABS
7.5000 mg | ORAL_TABLET | Freq: Every day | ORAL | Status: DC
  Administered 2017-05-18 – 2017-05-19 (×2): 7.5 mg via ORAL
  Filled 2017-05-18 (×2): qty 1

## 2017-05-18 MED ORDER — CLONAZEPAM 0.5 MG PO TABS
1.0000 mg | ORAL_TABLET | Freq: Every day | ORAL | Status: DC
  Administered 2017-05-18 – 2017-05-19 (×2): 1 mg via ORAL
  Filled 2017-05-18 (×2): qty 2

## 2017-05-18 MED ORDER — AMLODIPINE BESYLATE 5 MG PO TABS
5.0000 mg | ORAL_TABLET | Freq: Every day | ORAL | Status: DC
  Administered 2017-05-18 – 2017-05-20 (×3): 5 mg via ORAL
  Filled 2017-05-18 (×3): qty 1

## 2017-05-18 MED ORDER — SODIUM CHLORIDE 0.9 % IV BOLUS (SEPSIS)
1000.0000 mL | Freq: Once | INTRAVENOUS | Status: AC
  Administered 2017-05-18: 1000 mL via INTRAVENOUS

## 2017-05-18 MED ORDER — ASPIRIN EC 81 MG PO TBEC
81.0000 mg | DELAYED_RELEASE_TABLET | Freq: Every day | ORAL | Status: DC
  Administered 2017-05-19: 81 mg via ORAL
  Filled 2017-05-18 (×2): qty 1

## 2017-05-18 MED ORDER — CLOTRIMAZOLE 10 MG MT TROC
10.0000 mg | Freq: Three times a day (TID) | OROMUCOSAL | Status: DC
  Administered 2017-05-19 – 2017-05-20 (×4): 10 mg via ORAL
  Filled 2017-05-18 (×6): qty 1

## 2017-05-18 MED ORDER — HEPARIN SODIUM (PORCINE) 5000 UNIT/ML IJ SOLN
5000.0000 [IU] | Freq: Three times a day (TID) | INTRAMUSCULAR | Status: DC
  Administered 2017-05-18 – 2017-05-20 (×6): 5000 [IU] via SUBCUTANEOUS
  Filled 2017-05-18 (×6): qty 1

## 2017-05-18 MED ORDER — ASPIRIN 81 MG PO CHEW
162.0000 mg | CHEWABLE_TABLET | Freq: Once | ORAL | Status: AC
  Administered 2017-05-18: 162 mg via ORAL
  Filled 2017-05-18: qty 2

## 2017-05-18 NOTE — ED Notes (Signed)
TNI 0.13, Results to Rifenbark MD

## 2017-05-18 NOTE — ED Triage Notes (Signed)
Pt came to ED via EMS from home. Diagnosed with bladder infection 1 week ago, currently on antibiotics. Reports has not been feeling any better. Today pt got up to take morning meds, felt dizzy and had to sit down. VS stable at this time.

## 2017-05-18 NOTE — ED Notes (Signed)
Pt transported to room 140

## 2017-05-18 NOTE — ED Provider Notes (Signed)
[facility] Emergency Department Provider Note  ____________________________________________   First MD Initiated Contact with Patient 05/18/17 [phone]     (approximate)  I have reviewed the triage vital signs and the nursing notes.   HISTORY  Chief Complaint Weakness   HPI Kelsey Morrison is a 83 y.o. female 's family brings her to the emergency department today after a near syncopal episode while standing in the kitchen. She felt weak lightheaded and had to sit down before she passed out. She has reported dysuria recently and was recently treated for urinary tract infection a month ago with an unknown antibiotic. She initially improved but in the last several days and felt worse again. She denies back pain. She denies chest pain or shortness of breath. She denies fevers or chills. She denies sick contacts.She has had multiple abdominal surgeries including a cholecystectomy and an appendectomy in the past. She feels like her symptoms are slowly progressive. Nothing seems to make it better or worse.   Past Medical History:  Diagnosis Date  . Alzheimer's dementia   . Anxiety   . Cancer [facility])    breast cancer  . Depression   . Dry mouth   . HLD (hyperlipidemia)   . Hypertension   . Inflammatory arthritis    seronegative  . Osteoarthritis     Patient Active Problem List   Diagnosis Date Noted  . Sepsis ([place]) 05/18/2017  . Syncope and collapse 07/25/2015  . HTN (hypertension) 07/25/2015  . Anxiety 07/25/2015  . Alzheimer's dementia 07/25/2015  . Inflammatory arthritis 07/25/2015  . Dry mouth 07/25/2015    Past Surgical History:  Procedure Laterality Date  . ABDOMINAL HYSTERECTOMY    . APPENDECTOMY    . CHOLECYSTECTOMY    . MASTECTOMY Right 1990  . REPLACEMENT TOTAL KNEE BILATERAL      Prior to Admission medications   Medication Sig Start Date End Date Taking? Authorizing Provider  amLODipine (NORVASC) 5 MG tablet Take 5 mg by mouth daily.    Yes [provider]  antiseptic oral rinse (BIOTENE) LIQD 15 mLs by Mouth Rinse route at bedtime.   Yes [provider]  aspirin EC 81 MG tablet Take 81 mg by mouth at bedtime.   Yes [provider]  calcium citrate (CALCITRATE - DOSED IN MG ELEMENTAL CALCIUM) 950 MG tablet Take 200 mg of elemental calcium by mouth daily.   Yes [provider]  cimetidine (TAGAMET) 300 MG tablet Take 300 mg by mouth 2 (two) times daily.   Yes [provider]  clonazePAM (KLONOPIN) 1 MG tablet Take 1 mg by mouth at bedtime.    Yes [provider]  clotrimazole (MYCELEX) 10 MG troche Take 10 mg by mouth 3 (three) times daily. Pt takes 1 tab with every meal   Yes [provider]  Melatonin 5 MG TABS Take 1 tablet by mouth at bedtime.   Yes [provider]  memantine (NAMENDA) 10 MG tablet Take 10 mg by mouth 2 (two) times daily.   Yes [provider]  mirtazapine (REMERON) 7.5 MG tablet Take 7.5 mg by mouth at bedtime.   Yes [provider]  OLANZapine (ZYPREXA) 2.5 MG tablet Take 1.25 mg by mouth 2 (two) times daily.   Yes [provider]  predniSONE (DELTASONE) 5 MG tablet Take 5 mg by mouth daily with breakfast.   Yes [provider]  sulindac (CLINORIL) 200 MG tablet Take 200 mg by mouth 2 (two) times  daily.   Yes [provider]  triamcinolone (NASACORT) 55 MCG/ACT AERO nasal inhaler Place 1 spray into the nose at bedtime. 04/25/17  Yes [provider]    Allergies Celecoxib and Hydrocodone  Family History  Problem Relation Age of Onset  . Breast cancer Mother   . COPD Sister   . Breast cancer Sister     Social History Social History  Substance Use Topics  . Smoking status: Never Smoker  . Smokeless tobacco: Never Used  . Alcohol use No    Review of Systems Constitutional: No fever/chills Eyes: No visual changes. ENT: No sore throat. Cardiovascular: Denies chest  pain. Respiratory: Denies shortness of breath. Gastrointestinal: No abdominal pain.  No nausea, no vomiting.  No diarrhea.  No constipation. Genitourinary: Nausea for dysuria. Musculoskeletal: Negative for back pain. Skin: Negative for rash. Neurological: Negative for headaches, focal weakness or numbness.   ____________________________________________   PHYSICAL EXAM:  VITAL SIGNS: ED Triage Vitals [05/18/17 0928]  Enc Vitals Group     BP      Pulse      Resp      Temp      Temp src      SpO2      Weight 149 lb (67.6 kg)     Height 5\' 6"  (1.676 m)     Head Circumference      Peak Flow      Pain Score      Pain Loc      Pain Edu?      Excl. in [place]?     Constitutional: Alert and oriented x 4 Chronically ill-appearing somewhat thin and cachectic Eyes: PERRL EOMI. Head: Atraumatic. Nose: No congestion/rhinnorhea. Mouth/Throat: No trismus Neck: No stridor.   Cardiovascular: Normal rate, regular rhythm. Grossly normal heart sounds.  Good peripheral circulation. Respiratory: Normal respiratory effort.  No retractions. Lungs CTAB and moving good air Gastrointestinal: Soft nondistended nontender no rebound or guarding no peritonitis Musculoskeletal: No lower extremity edema   Neurologic:  Normal speech and language. No gross focal neurologic deficits are appreciated. Skin:  Skin is warm, dry and intact. No rash noted. Psychiatric: Mood and affect are normal. Speech and behavior are normal.    ____________________________________________   DIFFERENTIAL  Sepsis, urinary tract infection, pneumonia, cardiogenic syncope, vasovagal syncope, dehydration ____________________________________________   LABS (all labs ordered are listed, but only abnormal results are displayed)  Labs Reviewed  CULTURE, BLOOD (ROUTINE X 2) - Abnormal; Notable for the following:       Result Value   Special Requests   (*)    Value: AEROCOCCUS SPECIES Blood Culture results may not be optimal  due to an inadequate volume of blood received in culture bottles   All other components within normal limits  URINALYSIS, COMPLETE (UACMP) WITH MICROSCOPIC - Abnormal; Notable for the following:    Color, Urine AMBER (*)    APPearance TURBID (*)    Hgb urine dipstick MODERATE (*)    Protein, ur 100 (*)    Leukocytes, UA LARGE (*)    Bacteria, UA MANY (*)    Non Squamous Epithelial 0-5 (*)    All other components within normal limits  BASIC METABOLIC PANEL - Abnormal; Notable for the following:    Potassium 3.4 (*)    Chloride 100 (*)    Glucose, Bld 111 (*)    BUN 35 (*)    Creatinine, Ser 1.64 (*)    GFR calc non Af Amer 28 (*)  GFR calc Af Amer 33 (*)    All other components within normal limits  HEPATIC FUNCTION PANEL - Abnormal; Notable for the following:    Albumin 3.0 (*)    AST 67 (*)    Alkaline Phosphatase 237 (*)    Bilirubin, Direct 0.7 (*)    All other components within normal limits  TROPONIN I - Abnormal; Notable for the following:    Troponin I 0.13 (*)    All other components within normal limits  CBC WITH DIFFERENTIAL/PLATELET - Abnormal; Notable for the following:    Neutro Abs 7.3 (*)    Lymphs Abs 0.8 (*)    All other components within normal limits  TROPONIN I - Abnormal; Notable for the following:    Troponin I 0.07 (*)    All other components within normal limits  TROPONIN I - Abnormal; Notable for the following:    Troponin I 0.10 (*)    All other components within normal limits  TROPONIN I - Abnormal; Notable for the following:    Troponin I 0.08 (*)    All other components within normal limits  COMPREHENSIVE METABOLIC PANEL - Abnormal; Notable for the following:    Potassium 3.1 (*)    Glucose, Bld 127 (*)    BUN 29 (*)    Creatinine, Ser 1.15 (*)    Calcium 7.6 (*)    Total Protein 5.5 (*)    Albumin 2.2 (*)    AST 48 (*)    Alkaline Phosphatase 171 (*)    GFR calc non Af Amer 44 (*)    GFR calc Af Amer 51 (*)    All other components  within normal limits  CBC - Abnormal; Notable for the following:    RBC 3.34 (*)    Hemoglobin 10.2 (*)    HCT 29.9 (*)    All other components within normal limits  CULTURE, BLOOD (ROUTINE X 2)  URINE CULTURE    Urinalysis consistent with infection. Elevated troponin concerning for demand not primary ischemia. GFR down to 30 likely secondary to dehydration __________________________________________  EKG  ED ECG REPORT I, Darel Hong, the attending physician, personally viewed and interpreted this ECG.  Date: 05/19/2017 Rate: 97 Rhythm: normal sinus rhythm QRS Axis: normal Intervals: normal ST/T Wave abnormalities: normal Conduction Disturbances: none Narrative Interpretation: unremarkable  ____________________________________________  RADIOLOGY chest x-ray with no acute disease ____________________________________________   PROCEDURES  Procedure(s) performed: no  Procedures  Critical Care performed: yes  CRITICAL CARE Performed by: Darel Hong   Total critical care time: 35 minutes  Critical care time was exclusive of separately billable procedures and treating other patients.  Critical care was necessary to treat or prevent imminent or life-threatening deterioration.  Critical care was time spent personally by me on the following activities: development of treatment plan with patient and/or surrogate as well as nursing, discussions with consultants, evaluation of patient's response to treatment, examination of patient, obtaining history from patient or surrogate, ordering and performing treatments and interventions, ordering and review of laboratory studies, ordering and review of radiographic studies, pulse oximetry and re-evaluation of patient's condition.   Observation: no ____________________________________________   INITIAL IMPRESSION / ASSESSMENT AND PLAN / ED COURSE  Pertinent labs & imaging results that were available during my care of the  patient were reviewed by me and considered in my medical decision making (see chart for details).  The patient arrives uncomfortable appearing and borderline tachycardic. History of near syncope in an elderly patient with  history of UTIs concerning for sepsis. In and out catheter confirms urinary tract infection and she has significant acute kidney injury. She will continue to have fluid resuscitation and ceftriaxone for her UTI. Urine culture pending. At this point she'll require inpatient admission for continued resuscitation.      ____________________________________________   FINAL CLINICAL IMPRESSION(S) / ED DIAGNOSES  Final diagnoses:  Urinary tract infection without hematuria, site unspecified  Acute kidney injury ([place])  Elevated troponin      NEW MEDICATIONS STARTED DURING THIS VISIT:  Current Discharge Medication List       Note:  This document was prepared using Dragon voice recognition software and may include unintentional dictation errors.     Darel Hong, MD 05/19/17 1056

## 2017-05-18 NOTE — H&P (Signed)
Kelsey Morrison is an 83 y.o. female.   Chief Complaint: Weakness HPI: This is a 83 year old female who about 2 weeks ago was having dysuria. She was treated with antibiotics was completed a course but can't remember the name. At first she had improved. This Friday she complained about chills and about feeling bad. This morning she could not get out of bed because she was so weak and had urinated on herself. Here in the emergency room she was found to be septic. She also complained of reflux type symptoms of heartburn recently.  Past Medical History:  Diagnosis Date  . Alzheimer's dementia   . Anxiety   . Cancer [facility])    breast cancer  . Depression   . Dry mouth   . HLD (hyperlipidemia)   . Hypertension   . Inflammatory arthritis    seronegative  . Osteoarthritis     Past Surgical History:  Procedure Laterality Date  . ABDOMINAL HYSTERECTOMY    . APPENDECTOMY    . CHOLECYSTECTOMY    . MASTECTOMY Right 1990  . REPLACEMENT TOTAL KNEE BILATERAL      Family History  Problem Relation Age of Onset  . Breast cancer Mother   . COPD Sister   . Breast cancer Sister    Social History:  reports that she has never smoked. She has never used smokeless tobacco. She reports that she does not drink alcohol or use drugs.  Allergies:  Allergies  Allergen Reactions  . Celecoxib Other (See Comments)  . Hydrocodone Other (See Comments)    Altered mental status     (Not in a hospital admission)  Results for orders placed or performed during the hospital encounter of 05/18/17 (from the past 48 hour(s))  Basic metabolic panel     Status: Abnormal   Collection Time: 05/18/17  9:30 AM  Result Value Ref Range   Sodium 135 135 - 145 mmol/L   Potassium 3.4 (L) 3.5 - 5.1 mmol/L   Chloride 100 (L) 101 - 111 mmol/L   CO2 24 22 - 32 mmol/L   Glucose, Bld 111 (H) 65 - 99 mg/dL   BUN 35 (H) 6 - 20 mg/dL   Creatinine, Ser 1.64 (H) 0.44 - 1.00 mg/dL   Calcium 8.9 8.9 - 10.3 mg/dL   GFR calc non  Af Amer 28 (L) >60 mL/min   GFR calc Af Amer 33 (L) >60 mL/min    Comment: (NOTE) The eGFR has been calculated using the CKD EPI equation. This calculation has not been validated in all clinical situations. eGFR's persistently <60 mL/min signify possible Chronic Kidney Disease.    Anion gap 11 5 - 15  Hepatic function panel     Status: Abnormal   Collection Time: 05/18/17  9:30 AM  Result Value Ref Range   Total Protein 7.2 6.5 - 8.1 g/dL   Albumin 3.0 (L) 3.5 - 5.0 g/dL   AST 67 (H) 15 - 41 U/L   ALT 30 14 - 54 U/L   Alkaline Phosphatase 237 (H) 38 - 126 U/L   Total Bilirubin 1.2 0.3 - 1.2 mg/dL   Bilirubin, Direct 0.7 (H) 0.1 - 0.5 mg/dL   Indirect Bilirubin 0.5 0.3 - 0.9 mg/dL  Troponin I     Status: Abnormal   Collection Time: 05/18/17  9:30 AM  Result Value Ref Range   Troponin I 0.13 (HH) <0.03 ng/mL    Comment: CRITICAL RESULT CALLED TO, READ BACK BY AND VERIFIED WITH DONALD SWEENEY _0   ON 05/18/17 BY HKP   CBC with Differential     Status: Abnormal   Collection Time: 05/18/17  9:30 AM  Result Value Ref Range   WBC 8.7 3.6 - 11.0 K/uL   RBC 3.97 3.80 - 5.20 MIL/uL   Hemoglobin 12.1 12.0 - 16.0 g/dL   HCT 35.6 35.0 - 47.0 %   MCV 89.6 80.0 - 100.0 fL   MCH 30.5 26.0 - 34.0 pg   MCHC 34.1 32.0 - 36.0 g/dL   RDW 14.1 11.5 - 14.5 %   Platelets 202 150 - 440 K/uL   Neutrophils Relative % 85 %   Neutro Abs 7.3 (H) 1.4 - 6.5 K/uL   Lymphocytes Relative 9 %   Lymphs Abs 0.8 (L) 1.0 - 3.6 K/uL   Monocytes Relative 6 %   Monocytes Absolute 0.5 0.2 - 0.9 K/uL   Eosinophils Relative 0 %   Eosinophils Absolute 0.0 0 - 0.7 K/uL   Basophils Relative 0 %   Basophils Absolute 0.0 0 - 0.1 K/uL  Urinalysis, Complete w Microscopic     Status: Abnormal   Collection Time: 05/18/17  9:39 AM  Result Value Ref Range   Color, Urine AMBER (A) YELLOW    Comment: BIOCHEMICALS MAY BE AFFECTED BY COLOR   APPearance TURBID (A) CLEAR   Specific Gravity, Urine 1.017 1.005 - 1.030   pH  5.0 5.0 - 8.0   Glucose, UA NEGATIVE NEGATIVE mg/dL   Hgb urine dipstick MODERATE (A) NEGATIVE   Bilirubin Urine NEGATIVE NEGATIVE   Ketones, ur NEGATIVE NEGATIVE mg/dL   Protein, ur 100 (A) NEGATIVE mg/dL   Nitrite NEGATIVE NEGATIVE   Leukocytes, UA LARGE (A) NEGATIVE   RBC / HPF TOO NUMEROUS TO COUNT 0 - 5 RBC/hpf   WBC, UA TOO NUMEROUS TO COUNT 0 - 5 WBC/hpf   Bacteria, UA MANY (A) NONE SEEN   Squamous Epithelial / LPF NONE SEEN NONE SEEN   WBC Clumps PRESENT    Mucous PRESENT    Hyaline Casts, UA PRESENT    Granular Casts, UA PRESENT    Non Squamous Epithelial 0-5 (A) NONE SEEN   Dg Chest Port 1 View  Result Date: 05/18/2017 CLINICAL DATA:  Weakness and near syncope. EXAM: PORTABLE CHEST 1 VIEW COMPARISON:  07/23/2012 and prior exams FINDINGS: Upper limits normal heart size noted. There is no evidence of focal airspace disease, pulmonary edema, suspicious pulmonary nodule/mass, pleural effusion, or pneumothorax. No acute bony abnormalities are identified. IMPRESSION: No active disease. Electronically Signed   By: Margarette Canada M.D.   On: 05/18/2017 09:45    Review of Systems  Constitutional: Positive for chills and fever.  HENT: Negative for hearing loss.   Eyes: Negative for blurred vision.  Respiratory: Negative for cough and shortness of breath.   Cardiovascular: Negative for chest pain.  Gastrointestinal: Positive for heartburn.  Genitourinary: Negative for dysuria.  Musculoskeletal: Negative for myalgias.  Skin: Negative for rash.  Neurological: Negative for dizziness.  Psychiatric/Behavioral: Negative for depression.    Blood pressure 139/65, pulse 100, temperature (!) 100.7 F (38.2 C), resp. rate 20, height _0  (1.676 m), weight 67.6 kg (149 lb), SpO2 96 %. Physical Exam  Constitutional: She is oriented to person, place, and time. She appears well-developed and well-nourished. No distress.  HENT:  Head: Normocephalic and atraumatic.  Mouth/Throat: Oropharynx is  clear and moist. No oropharyngeal exudate.  Eyes: Conjunctivae are normal. Pupils are equal, round, and reactive to light. Left eye exhibits no  discharge.  Neck: Neck supple. No JVD present. No tracheal deviation present. No thyromegaly present.  Cardiovascular: Normal rate and regular rhythm.   No murmur heard. Respiratory: Effort normal and breath sounds normal. No respiratory distress. She exhibits no tenderness.  GI: Soft. Bowel sounds are normal. She exhibits no distension and no mass. There is no guarding.  Musculoskeletal: She exhibits no edema or tenderness.  Lymphadenopathy:    She has no cervical adenopathy.  Neurological: She is alert and oriented to person, place, and time.  Skin: Skin is warm and dry.     Assessment/Plan 1. Sepsis. Likely urinary source. We'll treat her aggressively with IV fluids and treat underlying calls. 2. Urinary tract infection. She is really generally treated with oral antibiotics but do not know which one. She originally got better but then worse. Suspect this may be resistant to what she was given and only partially treated. When started her on some IV Rocephin and get urinary and blood cultures. 3. Acute renal failure. Likely secondary to some mild dehydration and sepsis. She was also on NSAIDs which we have discontinued. We'll give her aggressive IV fluids and recheck renal function the morning. 4. Elevated troponin. Likely demand ischemia from the sepsis. We'll go ahead and cycle troponins and get an echocardiogram.  Total time spent 45 minutes.  Baxter Hire, MD 05/18/2017, 12:23 PM

## 2017-05-19 ENCOUNTER — Inpatient Hospital Stay: Admit: 2017-05-19 | Payer: Medicare Other

## 2017-05-19 ENCOUNTER — Inpatient Hospital Stay
Admit: 2017-05-19 | Discharge: 2017-05-19 | Disposition: A | Discharge status: Home / Self Care | Payer: Medicare Other | Attending: Internal Medicine | Admitting: Internal Medicine

## 2017-05-19 LAB — COMPREHENSIVE METABOLIC PANEL
ALBUMIN: 2.2 g/dL — AB (ref 3.5–5.0)
ALK PHOS: 171 U/L — AB (ref 38–126)
ALT: 24 U/L (ref 14–54)
AST: 48 U/L — AB (ref 15–41)
Anion gap: 6 (ref 5–15)
BUN: 29 mg/dL — ABNORMAL HIGH (ref 6–20)
CHLORIDE: 107 mmol/L (ref 101–111)
CO2: 22 mmol/L (ref 22–32)
CREATININE: 1.15 mg/dL — AB (ref 0.44–1.00)
Calcium: 7.6 mg/dL — ABNORMAL LOW (ref 8.9–10.3)
GFR calc non Af Amer: 44 mL/min — ABNORMAL LOW (ref >60)
GFR, EST AFRICAN AMERICAN: 51 mL/min — AB (ref >60)
GLUCOSE: 127 mg/dL — AB (ref 65–99)
Potassium: 3.1 mmol/L — ABNORMAL LOW (ref 3.5–5.1)
SODIUM: 135 mmol/L (ref 135–145)
Total Bilirubin: 1.2 mg/dL (ref 0.3–1.2)
Total Protein: 5.5 g/dL — ABNORMAL LOW (ref 6.5–8.1)

## 2017-05-19 LAB — CBC
HCT: 29.9 % — ABNORMAL LOW (ref 35.0–47.0)
Hemoglobin: 10.2 g/dL — ABNORMAL LOW (ref 12.0–16.0)
MCH: 30.5 pg (ref 26.0–34.0)
MCHC: 34.1 g/dL (ref 32.0–36.0)
MCV: 89.5 fL (ref 80.0–100.0)
PLATELETS: 160 10*3/uL (ref 150–440)
RBC: 3.34 MIL/uL — AB (ref 3.80–5.20)
RDW: 14 % (ref 11.5–14.5)
WBC: 6 10*3/uL (ref 3.6–11.0)

## 2017-05-19 LAB — TROPONIN I: TROPONIN I: 0.07 ng/mL — AB (ref <0.03)

## 2017-05-19 MED ORDER — POTASSIUM CHLORIDE CRYS ER 20 MEQ PO TBCR
40.0000 meq | EXTENDED_RELEASE_TABLET | Freq: Once | ORAL | Status: AC
  Administered 2017-05-19: 40 meq via ORAL
  Filled 2017-05-19: qty 2

## 2017-05-19 NOTE — Progress Notes (Addendum)
[place] [facility] [place] NAME: Kelsey Morrison    MR#:  [id number]  DATE OF BIRTH:  [birth year]  SUBJECTIVE:   Patient feeling Better this a.m. Denies dysuria Nurse reports the patient has gone progressively weak.   REVIEW OF SYSTEMS:     Review of Systems  Constitutional: Negative for fever, chills weight loss HENT: Negative for ear pain, nosebleeds, congestion, facial swelling, rhinorrhea, neck pain, neck stiffness and ear discharge.   Respiratory: Negative for cough, shortness of breath, wheezing  Cardiovascular: Negative for chest pain, palpitations and leg swelling.  Gastrointestinal: Negative for heartburn, abdominal pain, vomiting, diarrhea or consitpation Genitourinary: Negative for dysuria, urgency, frequency, hematuria Musculoskeletal: Negative for back pain or joint pain Positive generalized weakness Neurological: Negative for dizziness, seizures, syncope, focal weakness,  numbness and headaches.  Hematological: Does not bruise/bleed easily.  Psychiatric/Behavioral: Negative for hallucinations, confusion, dysphoric mood    Tolerating Diet: yes      DRUG ALLERGIES:   Allergies  Allergen Reactions  . Celecoxib Other (See Comments)  . Hydrocodone Other (See Comments)    Altered mental status    VITALS:  Blood pressure 125/64, pulse 95, temperature 98.7 F (37.1 C), temperature source Oral, resp. rate 18, height 5\' 6"  (1.676 m), weight 67.6 kg (149 lb), SpO2 99 %.  PHYSICAL EXAMINATION:  Constitutional: Appears well-developed and well-nourished. No distress. HENT: Normocephalic. Marland Kitchen Oropharynx is clear and moist.  Eyes: Conjunctivae and EOM are normal. PERRLA, no scleral icterus.  Neck: Normal ROM. Neck supple. No JVD. No tracheal deviation. CVS: RRR, S1/S2 +, no murmurs, no gallops, no carotid bruit.  Pulmonary: Effort and breath sounds normal, no stridor, rhonchi, wheezes, rales.  Abdominal: Soft. BS +,  no distension,  tenderness, rebound or guarding.  Musculoskeletal: Normal range of motion 4/5 bilateral extremity strength.  Significant arthritic changes in hands No edema and no tenderness.  Neuro: Alert. CN 2-12 grossly intact. No focal deficits. Skin: Skin is warm and dry. No rash noted. Psychiatric: Normal mood and affect.      LABORATORY PANEL:   CBC  Recent Labs Lab 05/19/17 0208  WBC 6.0  HGB 10.2*  HCT 29.9*  PLT 160   ------------------------------------------------------------------------------------------------------------------  Chemistries   Recent Labs Lab 05/19/17 0208  NA 135  K 3.1*  CL 107  CO2 22  GLUCOSE 127*  BUN 29*  CREATININE 1.15*  CALCIUM 7.6*  AST 48*  ALT 24  ALKPHOS 171*  BILITOT 1.2   ------------------------------------------------------------------------------------------------------------------  Cardiac Enzymes  Recent Labs Lab 05/18/17 1615 05/18/17 2203 05/19/17 0208  TROPONINI 0.10* 0.08* 0.07*   ------------------------------------------------------------------------------------------------------------------  RADIOLOGY:  Dg Chest Port 1 View  Result Date: 05/18/2017 CLINICAL DATA:  Weakness and near syncope. EXAM: PORTABLE CHEST 1 VIEW COMPARISON:  07/23/2012 and prior exams FINDINGS: Upper limits normal heart size noted. There is no evidence of focal airspace disease, pulmonary edema, suspicious pulmonary nodule/mass, pleural effusion, or pneumothorax. No acute bony abnormalities are identified. IMPRESSION: No active disease. Electronically Signed   By: Margarette Canada M.D.   On: 05/18/2017 09:45     ASSESSMENT AND PLAN:    83 year old female with Alzheimer's dementia who presented with dysuria and found to have sepsis.  1. Sepsis: Patient presented with tachycardia and fever Sepsis is due to recurrent urinary tract infection  2. UTI: Continue Rocephin and follow-up on final urine culture  3. Generalized weakness: Physical  therapy consultation  4. Hypokalemia: Replete and recheck in a.m  5. Elevated troponin  in the setting of demand ischemia. Patient has been ruled out for non-ST elevation MI  6. Acute kidney injury in the setting of dehydration and sepsis: Creatinine has improved with IV fluids and discontinuation of NSAIDs.   7. Alzheimer's dementia: Continue Namenda  8. Depression and anxiety: Continue Remeron and Klonopin and Zyprexa.   Management plans discussed with the patient and husband and they are is in agreement.  CODE STATUS: full  TOTAL TIME TAKING CARE OF THIS PATIENT: 30 minutes.     POSSIBLE D/C tomorrow, DEPENDING ON CLINICAL CONDITION.   Teller Wakefield M.D on 05/19/2017 at 9:41 AM  Between 7am to 6pm - Pager - [phone] After 6pm go to [email or web address] - password [email or web address] [facility]  Office  [phone]  CC: Primary care physician; [place] Pink, MD  Note: This dictation was prepared with Dragon dictation along with smaller phrase technology. Any transcriptional errors that result from this process are unintentional.

## 2017-05-19 NOTE — Evaluation (Signed)
Physical Therapy Evaluation Patient Details Name: Kelsey Morrison MRN: [id number] DOB: 1934-07-23 Today's Date: 05/19/2017   History of Present Illness  Pt is an 83 y.o. female presenting to hospital with dysuria, tachycardia, and fever.  Pt admitted with sepsis, UTI, acute renal failure, elevated troponin (per notes likely demand ischemia from sepsis).  PMH includes Alzheimer's dementia, anxiety, breast CA, depression, htn, and B TKR.  Clinical Impression  Prior to hospital admission, pt reports ambulating with SPC.  Pt reports living with her husband in 1 level home with steps to enter.  Pt demonstrating some confusion and difficulty with following multiple step commands during session.  Currently pt is CGA supine to sit, transfers, and ambulation around nursing loop with RW.  Pt requiring initial encouragement with bed mobility (pt stating "I can't") but pt did well with functional mobility after that.  Pt would benefit from skilled PT to address noted impairments and functional limitations (see below for any additional details).  Upon hospital discharge, recommend pt discharge to home with SBA for functional mobility for safety (with use of RW).    Follow Up Recommendations Home health PT;Supervision for mobility/OOB    Equipment Recommendations  Rolling walker with 5" wheels    Recommendations for Other Services       Precautions / Restrictions Precautions Precautions: Fall Restrictions Weight Bearing Restrictions: No      Mobility  Bed Mobility Overal bed mobility: Needs Assistance Bed Mobility: Supine to Sit     Supine to sit: Min guard;HOB elevated     General bed mobility comments: vc's for use of bedrail; increased time and encouragement required for pt to perform bed mobility (pt stating "I can't" multiple times)  Transfers Overall transfer level: Needs assistance Equipment used: Rolling walker (2 wheeled) Transfers: Sit to/from [facility] Sit to  Stand: Min guard Stand pivot transfers: Min guard       General transfer comment: fairly strong stand with RW; stand step turn bed to bedside commode without loss of balance (increased vc's required to find commode)  Ambulation/Gait Ambulation/Gait assistance: Min guard Ambulation Distance (Feet): 200 Feet Assistive device: Rolling walker (2 wheeled)   Gait velocity: mildly decreased   General Gait Details: decreased B step length; steady without loss of balance  Stairs            Wheelchair Mobility    Modified Rankin (Stroke Patients Only)       Balance Overall balance assessment: Needs assistance Sitting-balance support: No upper extremity supported;Feet supported Sitting balance-Leahy Scale: Good Sitting balance - Comments: sitting reaching within BOS   Standing balance support: Bilateral upper extremity supported;During functional activity (on RW) Standing balance-Leahy Scale: Good Standing balance comment: steady with ambulation                             Pertinent Vitals/Pain Pain Assessment: No/denies pain  Vitals (HR and O2 on room air) stable and WFL throughout treatment session.    Home Living Family/patient expects to be discharged to:: Private residence Living Arrangements: Spouse/significant other (Husband) Available Help at Discharge: Family Type of Home: House Home Access: Stairs to enter Entrance Stairs-Rails: None Entrance Stairs-Number of Steps: 3 Home Layout: One level Home Equipment: Cane - single point (pt reports having walker but unsure how many wheels)      Prior Function Level of Independence: Independent with assistive device(s)         Comments: Pt reports being  independent with SPC but no family present to verify above information.  Pt denies any falls in past 6 months.     Hand Dominance        Extremity/Trunk Assessment   Upper Extremity Assessment Upper Extremity Assessment: Generalized weakness     Lower Extremity Assessment Lower Extremity Assessment: Generalized weakness       Communication   Communication: HOH  Cognition Arousal/Alertness: Awake/alert Behavior During Therapy: WFL for tasks assessed/performed Overall Cognitive Status: No family/caregiver present to determine baseline cognitive functioning (Oriented to person and place)                                        General Comments General comments (skin integrity, edema, etc.): Pt requesting to toilet upon PT entering room.  Nursing cleared pt for participation in physical therapy.  Pt agreeable to PT session.    Exercises  Functional mobility   Assessment/Plan    PT Assessment Patient needs continued PT services  PT Problem List Decreased strength;Decreased balance;Decreased mobility       PT Treatment Interventions DME instruction;Gait training;Stair training;Functional mobility training;Therapeutic activities;Therapeutic exercise;Balance training;Patient/family education    PT Goals (Current goals can be found in the Care Plan section)  Acute Rehab PT Goals Patient Stated Goal: to go home PT Goal Formulation: With patient Time For Goal Achievement: 06/02/17 Potential to Achieve Goals: Good    Frequency Min 2X/week   Barriers to discharge        Co-evaluation               AM-PAC PT "6 Clicks" Daily Activity  Outcome Measure Difficulty turning over in bed (including adjusting bedclothes, sheets and blankets)?: Total Difficulty moving from lying on back to sitting on the side of the bed? : Total Difficulty sitting down on and standing up from a chair with arms (e.g., wheelchair, bedside commode, etc,.)?: Total Help needed moving to and from a bed to chair (including a wheelchair)?: A Little Help needed walking in hospital room?: A Little Help needed climbing 3-5 steps with a railing? : A Little 6 Click Score: 12    End of Session Equipment Utilized During Treatment:  Gait belt Activity Tolerance: Patient tolerated treatment well Patient left: in chair;with call bell/phone within reach;with chair alarm set;with family/visitor present (Pt's neighbor's present) Nurse Communication: Mobility status;Precautions PT Visit Diagnosis: Muscle weakness (generalized) (M62.81);Difficulty in walking, not elsewhere classified (R26.2)    Time: 1356-1430 PT Time Calculation (min) (ACUTE ONLY): 34 min   Charges:   PT Evaluation $PT Eval Low Complexity: 1 Procedure PT Treatments $Therapeutic Activity: 8-22 mins   PT G CodesLeitha Bleak, PT 05/19/17, 5:07 PM [phone]

## 2017-05-20 LAB — BASIC METABOLIC PANEL
Anion gap: 6 (ref 5–15)
BUN: 28 mg/dL — AB (ref 6–20)
CO2: 21 mmol/L — ABNORMAL LOW (ref 22–32)
Calcium: 8.3 mg/dL — ABNORMAL LOW (ref 8.9–10.3)
Chloride: 111 mmol/L (ref 101–111)
Creatinine, Ser: 1.06 mg/dL — ABNORMAL HIGH (ref 0.44–1.00)
GFR calc Af Amer: 56 mL/min — ABNORMAL LOW (ref >60)
GFR, EST NON AFRICAN AMERICAN: 48 mL/min — AB (ref >60)
GLUCOSE: 90 mg/dL (ref 65–99)
POTASSIUM: 3.3 mmol/L — AB (ref 3.5–5.1)
SODIUM: 138 mmol/L (ref 135–145)

## 2017-05-20 LAB — URINE CULTURE

## 2017-05-20 LAB — ECHOCARDIOGRAM COMPLETE
Height: 66 in
Weight: 2384 oz

## 2017-05-20 MED ORDER — POTASSIUM CHLORIDE CRYS ER 20 MEQ PO TBCR
40.0000 meq | EXTENDED_RELEASE_TABLET | Freq: Once | ORAL | Status: AC
  Administered 2017-05-20: 40 meq via ORAL
  Filled 2017-05-20: qty 2

## 2017-05-20 MED ORDER — CEPHALEXIN 500 MG PO CAPS
500.0000 mg | ORAL_CAPSULE | Freq: Three times a day (TID) | ORAL | Status: DC
  Administered 2017-05-20: 500 mg via ORAL
  Filled 2017-05-20: qty 1

## 2017-05-20 MED ORDER — CEPHALEXIN 500 MG PO CAPS: 500.0000 mg | ORAL_CAPSULE | Freq: Three times a day (TID) | ORAL | 0 refills | Status: AC

## 2017-05-20 NOTE — Care Management Note (Signed)
Case Management Note  Patient Details  Name: Kelsey Morrison MRN: [id number] Date of Birth: 1934-08-04  Subjective/Objective:                    Action/Plan: Ordered bedside commode to be delivered to the home at husbands request  Expected Discharge Date:  05/20/17               Expected Discharge Plan:  [place]  In-House Referral:     Discharge planning Services  CM Consult  Post Acute Care Choice:  Home Health Choice offered to:  Spouse  DME Arranged:   [facility] DME Agency:   [facility]  HH Arranged:  PT [place] Agency:  [place]  Status of Service:  Completed, signed off  If discussed at [place] of Stay Meetings, dates discussed:    Additional Comments:  Jolly Mango, RN 05/20/2017, 11:48 AM

## 2017-05-20 NOTE — Progress Notes (Signed)
Clinical Social Worker (CSW) received SNF consult. PT is recommending home health. RN case manager aware of above. Please reconsult if future social work needs arise. CSW signing off.   Terriana Barreras, LCSW [phone] 

## 2017-05-20 NOTE — Progress Notes (Signed)
Shift assessment completed. Pt found attempting to get oob to use bathroom, this writer assisted pt to bedside commode. Pt has some effort to pull herself up off of the bed, but movement became easier the more she moved around. Pt oriented to self and place,denied pain. Pt stated "Milbert Coulter wants me to get up at 8-8:30, but I had ot get up earlier to use the bathroom." This writer explained to pt that Milbert Coulter was not here, and it was ok that pt had needed to get up. Assessment benign. piv intact to L arm with iv ns infusing per md order, site wrapped in kling. since assessment, pt's husband arrived at bedside, he told this writer that he did not want to take pt home because he cant take care of her the way we are having to take care of her here. This Probation officer asked if he had discussed this with the doctor, he said he had. This Probation officer also relayed husband's concerns to doctor. Social work and case mgt discussed with with husband as well. Per case mgt, husband was offered home health nurse and refused, was offered private sitter list and refused, and was offered referral outpatient for social work to look for placement/plan for placement in the future for patient and husband had refused. Husband does not feel that patient is ready for placement yet. This Probation officer also explained that PT had cleared pt for d/c since pt ambulated 200 feet yesterday with PT successfully. Husband said he felt pt would be unable to do this today. When husband present, patient is noted to be quiet, did not offer any input in discussion.

## 2017-05-20 NOTE — Plan of Care (Signed)
Problem: Bowel/Gastric: Goal: Will not experience complications related to bowel motility Outcome: Completed/Met Date Met: 05/20/17 Pt has met goals for discharge.   

## 2017-05-20 NOTE — Care Management Important Message (Signed)
Important Message  Patient Details  Name: Kelsey Morrison DOBLER MRN: [id number] Date of Birth: 07-07-1934   Medicare Important Message Given:  N/A - LOS <3 / Initial given by admissions    Jolly Mango, RN 05/20/2017, 10:55 AM

## 2017-05-20 NOTE — Care Management Note (Addendum)
Case Management Note  Patient Details  Name: Kelsey Morrison MRN: [id number] Date of Birth: 08/13/1934  Subjective/Objective:  Met with patient and her spouse at bedside.Patient sitting up in bed watching tv. Spoke with Mr. Urbieta. He is concerned that she is not ready to be discharged. Reviewed her progress with him and MD recommendations. Patient walked 200 ft with PT. Offered choice of home health agencies for [place]. Referral to [facility]. Offered SW and [facility] and he refused both.He didn't want placement for the future.  " She aint that bad yet" Archivist list, he refused. Patient has a walker in the home. PCP is [place] Pink.                 Action/Plan: [facility] for HHPT.   Expected Discharge Date:  05/20/17               Expected Discharge Plan:  [place]  In-House Referral:     Discharge planning Services  CM Consult  Post Acute Care Choice:  Home Health Choice offered to:  Spouse  DME Arranged:    DME Agency:     HH Arranged:  PT [place]:  [place]  Status of Service:  Completed, signed off  If discussed at [place] of Stay Meetings, dates discussed:    Additional Comments:  Jolly Mango, RN 05/20/2017, 9:53 AM

## 2017-05-20 NOTE — Discharge Summary (Signed)
[place] [facility] [place] NAME: Kelsey Morrison    MR#:  [id number]  DATE OF BIRTH:  12-07-1934  DATE OF ADMISSION:  05/18/2017 ADMITTING PHYSICIAN: Baxter Hire, MD  DATE OF DISCHARGE: 05/20/2017  PRIMARY CARE PHYSICIAN: Maryland Pink, MD    ADMISSION DIAGNOSIS:  Weakness  DISCHARGE DIAGNOSIS:  Active Problems:   Sepsis ([place])   SECONDARY DIAGNOSIS:   Past Medical History:  Diagnosis Date  . Alzheimer's dementia   . Anxiety   . Cancer [facility])    breast cancer  . Depression   . Dry mouth   . HLD (hyperlipidemia)   . Hypertension   . Inflammatory arthritis    seronegative  . Osteoarthritis     HOSPITAL COURSE:   83 year old female with Alzheimer's dementia who presented with dysuria and found to have sepsis.  1. Sepsis: Patient presented with tachycardia and fever Sepsis is due to recurrent urinary tract infection  2. Escherichia coli UTI: Patient was initiated on Rocephin. Urine culture is pansensitive. She will be discharged on Keflex.  3. Generalized weakness: Physical therapy consultation has recommended home with home health care  4. Hypokalemia: This was repleted prior to discharge. PCP will need to repeat BMP at next visit   5. Elevated troponin in the setting of demand ischemia. Patient has been ruled out for non-ST elevation MI  6. Acute kidney injury in the setting of dehydration and sepsis: Creatinine has improved with IV fluids and discontinuation of NSAIDs.   7. Alzheimer's dementia: Continue Namenda  8. Depression and anxiety: Continue Remeron and Klonopin and Zyprexa.   DISCHARGE CONDITIONS AND DIET:   Stable for discharge on a cardiac diet  CONSULTS OBTAINED:    DRUG ALLERGIES:   Allergies  Allergen Reactions  . Celecoxib Other (See Comments)  . Hydrocodone Other (See Comments)    Altered mental status    DISCHARGE MEDICATIONS:   Current Discharge Medication List    START  taking these medications   Details  cephALEXin (KEFLEX) 500 MG capsule Take 1 capsule (500 mg total) by mouth every 8 (eight) hours. Qty: 15 capsule, Refills: 0      CONTINUE these medications which have NOT CHANGED   Details  amLODipine (NORVASC) 5 MG tablet Take 5 mg by mouth daily.    antiseptic oral rinse (BIOTENE) LIQD 15 mLs by Mouth Rinse route at bedtime.    aspirin EC 81 MG tablet Take 81 mg by mouth at bedtime.    calcium citrate (CALCITRATE - DOSED IN MG ELEMENTAL CALCIUM) 950 MG tablet Take 200 mg of elemental calcium by mouth daily.    cimetidine (TAGAMET) 300 MG tablet Take 300 mg by mouth 2 (two) times daily.    clonazePAM (KLONOPIN) 1 MG tablet Take 1 mg by mouth at bedtime.     clotrimazole (MYCELEX) 10 MG troche Take 10 mg by mouth 3 (three) times daily. Pt takes 1 tab with every meal    Melatonin 5 MG TABS Take 1 tablet by mouth at bedtime.    memantine (NAMENDA) 10 MG tablet Take 10 mg by mouth 2 (two) times daily.    mirtazapine (REMERON) 7.5 MG tablet Take 7.5 mg by mouth at bedtime.    OLANZapine (ZYPREXA) 2.5 MG tablet Take 1.25 mg by mouth 2 (two) times daily.    predniSONE (DELTASONE) 5 MG tablet Take 5 mg by mouth daily with breakfast.    triamcinolone (NASACORT) 55 MCG/ACT AERO nasal inhaler Place 1 spray  into the nose at bedtime. Refills: 11      STOP taking these medications     sulindac (CLINORIL) 200 MG tablet           Today   CHIEF COMPLAINT:    No issues overnight. Patient doing well this point. Eating breakfast.   VITAL SIGNS:  Blood pressure (!) 178/73, pulse (!) 103, temperature 98.6 F (37 C), temperature source Oral, resp. rate 17, height 5\' 6"  (1.676 m), weight 67.6 kg (149 lb), SpO2 95 %.   REVIEW OF SYSTEMS:  Review of Systems  Constitutional: Negative.  Negative for chills, fever and malaise/fatigue.  HENT: Negative.  Negative for ear discharge, ear pain, hearing loss, nosebleeds and sore throat.   Eyes:  Negative.  Negative for blurred vision and pain.  Respiratory: Negative.  Negative for cough, hemoptysis, shortness of breath and wheezing.   Cardiovascular: Negative.  Negative for chest pain, palpitations and leg swelling.  Gastrointestinal: Negative.  Negative for abdominal pain, blood in stool, diarrhea, nausea and vomiting.  Genitourinary: Negative.  Negative for dysuria.  Musculoskeletal: Negative.  Negative for back pain.  Skin: Negative.   Neurological: Negative for dizziness, tremors, speech change, focal weakness, seizures and headaches.  Endo/Heme/Allergies: Negative.  Does not bruise/bleed easily.  Psychiatric/Behavioral: Positive for memory loss. Negative for depression, hallucinations and suicidal ideas.     PHYSICAL EXAMINATION:  GENERAL:  83 y.o.-year-old patient lying in the bed with no acute distress.  NECK:  Supple, no jugular venous distention. No thyroid enlargement, no tenderness.  LUNGS: Normal breath sounds bilaterally, no wheezing, rales,rhonchi  No use of accessory muscles of respiration.  CARDIOVASCULAR: S1, S2 normal. No murmurs, rubs, or gallops.  ABDOMEN: Soft, non-tender, non-distended. Bowel sounds present. No organomegaly or mass.  EXTREMITIES: No pedal edema, cyanosis, or clubbing.  PSYCHIATRIC: The patient is alert and oriented x Name and place month SKIN: No obvious rash, lesion, or ulcer.   DATA REVIEW:   CBC  Recent Labs Lab 05/19/17 0208  WBC 6.0  HGB 10.2*  HCT 29.9*  PLT 160    Chemistries   Recent Labs Lab 05/19/17 0208 05/20/17 0510  NA 135 138  K 3.1* 3.3*  CL 107 111  CO2 22 21*  GLUCOSE 127* 90  BUN 29* 28*  CREATININE 1.15* 1.06*  CALCIUM 7.6* 8.3*  AST 48*  --   ALT 24  --   ALKPHOS 171*  --   BILITOT 1.2  --     Cardiac Enzymes  Recent Labs Lab 05/18/17 1615 05/18/17 2203 05/19/17 0208  TROPONINI 0.10* 0.08* 0.07*    Microbiology Results  @MICRORSLT48 @  RADIOLOGY:  No results found.    Current  Discharge Medication List    START taking these medications   Details  cephALEXin (KEFLEX) 500 MG capsule Take 1 capsule (500 mg total) by mouth every 8 (eight) hours. Qty: 15 capsule, Refills: 0      CONTINUE these medications which have NOT CHANGED   Details  amLODipine (NORVASC) 5 MG tablet Take 5 mg by mouth daily.    antiseptic oral rinse (BIOTENE) LIQD 15 mLs by Mouth Rinse route at bedtime.    aspirin EC 81 MG tablet Take 81 mg by mouth at bedtime.    calcium citrate (CALCITRATE - DOSED IN MG ELEMENTAL CALCIUM) 950 MG tablet Take 200 mg of elemental calcium by mouth daily.    cimetidine (TAGAMET) 300 MG tablet Take 300 mg by mouth 2 (two) times daily.  clonazePAM (KLONOPIN) 1 MG tablet Take 1 mg by mouth at bedtime.     clotrimazole (MYCELEX) 10 MG troche Take 10 mg by mouth 3 (three) times daily. Pt takes 1 tab with every meal    Melatonin 5 MG TABS Take 1 tablet by mouth at bedtime.    memantine (NAMENDA) 10 MG tablet Take 10 mg by mouth 2 (two) times daily.    mirtazapine (REMERON) 7.5 MG tablet Take 7.5 mg by mouth at bedtime.    OLANZapine (ZYPREXA) 2.5 MG tablet Take 1.25 mg by mouth 2 (two) times daily.    predniSONE (DELTASONE) 5 MG tablet Take 5 mg by mouth daily with breakfast.    triamcinolone (NASACORT) 55 MCG/ACT AERO nasal inhaler Place 1 spray into the nose at bedtime. Refills: 11      STOP taking these medications     sulindac (CLINORIL) 200 MG tablet             Management plans discussed with the patient and husband and she is in agreement. Stable for discharge   Patient should follow up with pcp  CODE STATUS:     Code Status Orders        Start     Ordered   05/18/17 1413  Full code  Continuous     05/18/17 1412    Code Status History    Date Active Date Inactive Code Status Order ID Comments User Context   07/25/2015  5:35 PM 07/27/2015  2:02 PM Full Code [id number]  Lance Coon, MD Inpatient      TOTAL TIME TAKING CARE  OF THIS PATIENT: 37 minutes.    Note: This dictation was prepared with Dragon dictation along with smaller phrase technology. Any transcriptional errors that result from this process are unintentional.  Merrianne Mccumbers M.D on 05/20/2017 at 9:44 AM  Between 7am to 6pm - Pager - [phone] After 6pm go to [email or web address] - password [email or web address] [facility]  Office  [phone]  CC: Primary care physician; [place] Pink, MD

## 2017-05-20 NOTE — Progress Notes (Signed)
Discharge Instructions reviewed with patient, patient's spouse and patients sister was present. Spouse reminded to pick up patient's antibiotic from the pharmacy listed on discharge paperwork. Permission was granted to discuss discharge information with sister. Patient's spouse verbalized understanding, but did voice his concerns that he felt patient was being discharged to early and could not do for herself. Patient's spouse requested a bedside commode- per CM, bedside commode to be sent to patient's home. Spoke with spouse, extensively, he verbalized that he had been offered services but he had refused most suggestions as he states that "She's not that bad yet" and that he is not "putting her in a rest home." Support provided to family. Patient ambulatory to wheelchair, escorted out to car via wheelchair by Nurse Tech. Glasses and clothing on patient. Patient thanked the staff for "taking such good care" of her.

## 2017-05-23 LAB — CULTURE, BLOOD (ROUTINE X 2)
CULTURE: NO GROWTH
Culture: NO GROWTH
SPECIAL REQUESTS: ADEQUATE

## 2017-05-24 ENCOUNTER — Emergency Department
Admission: EM | Admit: 2017-05-24 | Discharge: 2017-05-24 | Disposition: A | Discharge status: Home / Self Care | Payer: Medicare Other | Attending: Student in an Organized Health Care Education/Training Program | Admitting: Student in an Organized Health Care Education/Training Program

## 2017-05-24 ENCOUNTER — Encounter: Payer: Self-pay | Admitting: Emergency Medicine

## 2017-05-24 ENCOUNTER — Emergency Department: Payer: Medicare Other

## 2017-05-24 DIAGNOSIS — R2243 Localized swelling, mass and lump, lower limb, bilateral: Secondary | ICD-10-CM | POA: Diagnosis not present

## 2017-05-24 DIAGNOSIS — Z79899 Other long term (current) drug therapy: Secondary | ICD-10-CM | POA: Insufficient documentation

## 2017-05-24 DIAGNOSIS — I10 Essential (primary) hypertension: Secondary | ICD-10-CM | POA: Diagnosis not present

## 2017-05-24 DIAGNOSIS — R600 Localized edema: Secondary | ICD-10-CM

## 2017-05-24 DIAGNOSIS — Z7982 Long term (current) use of aspirin: Secondary | ICD-10-CM | POA: Insufficient documentation

## 2017-05-24 LAB — BASIC METABOLIC PANEL
Anion gap: 10 (ref 5–15)
BUN: 18 mg/dL (ref 6–20)
CALCIUM: 8.7 mg/dL — AB (ref 8.9–10.3)
CO2: 23 mmol/L (ref 22–32)
Chloride: 100 mmol/L — ABNORMAL LOW (ref 101–111)
Creatinine, Ser: 1.2 mg/dL — ABNORMAL HIGH (ref 0.44–1.00)
GFR calc Af Amer: 48 mL/min — ABNORMAL LOW (ref >60)
GFR, EST NON AFRICAN AMERICAN: 42 mL/min — AB (ref >60)
Glucose, Bld: 171 mg/dL — ABNORMAL HIGH (ref 65–99)
POTASSIUM: 3.8 mmol/L (ref 3.5–5.1)
SODIUM: 133 mmol/L — AB (ref 135–145)

## 2017-05-24 LAB — CBC
HCT: 31.5 % — ABNORMAL LOW (ref 35.0–47.0)
Hemoglobin: 10.9 g/dL — ABNORMAL LOW (ref 12.0–16.0)
MCH: 30.6 pg (ref 26.0–34.0)
MCHC: 34.4 g/dL (ref 32.0–36.0)
MCV: 88.9 fL (ref 80.0–100.0)
Platelets: 383 10*3/uL (ref 150–440)
RBC: 3.54 MIL/uL — AB (ref 3.80–5.20)
RDW: 14.1 % (ref 11.5–14.5)
WBC: 8.9 10*3/uL (ref 3.6–11.0)

## 2017-05-24 LAB — BRAIN NATRIURETIC PEPTIDE: B Natriuretic Peptide: 113 pg/mL — ABNORMAL HIGH (ref 0.0–100.0)

## 2017-05-24 LAB — TROPONIN I

## 2017-05-24 MED ORDER — FUROSEMIDE 40 MG PO TABS
40.0000 mg | ORAL_TABLET | Freq: Once | ORAL | Status: AC
  Administered 2017-05-24: 40 mg via ORAL
  Filled 2017-05-24: qty 1

## 2017-05-24 MED ORDER — T.E.D. BELOW KNEE/S-REGULAR MISC: 2.0000 [IU] | Freq: Every day | 0 refills | Status: DC

## 2017-05-24 MED ORDER — AMLODIPINE BESYLATE 5 MG PO TABS
5.0000 mg | ORAL_TABLET | Freq: Once | ORAL | Status: AC
  Administered 2017-05-24: 5 mg via ORAL
  Filled 2017-05-24: qty 1

## 2017-05-24 NOTE — ED Provider Notes (Signed)
[facility] Emergency Department Provider Note    First MD Initiated Contact with Patient 05/24/17 1713     (approximate)  I have reviewed the triage vital signs and the nursing notes.   HISTORY  Chief Complaint Leg Swelling    HPI Kelsey Morrison is a 83 y.o. female presents with chief complaint of bilateral lower extremity edema. States that she noted this over the past day. Was recently admitted hospital for UTI. Denies any shortness of breath orthopnea. No worsening dyspnea on exertion. Patient had recent echocardiogram which showed normal EF.  No nausea or vomiting.   Past Medical History:  Diagnosis Date  . Alzheimer's dementia   . Anxiety   . Cancer [facility])    breast cancer  . Depression   . Dry mouth   . HLD (hyperlipidemia)   . Hypertension   . Inflammatory arthritis    seronegative  . Osteoarthritis    Family History  Problem Relation Age of Onset  . Breast cancer Mother   . COPD Sister   . Breast cancer Sister    Past Surgical History:  Procedure Laterality Date  . ABDOMINAL HYSTERECTOMY    . APPENDECTOMY    . CHOLECYSTECTOMY    . MASTECTOMY Right 1990  . REPLACEMENT TOTAL KNEE BILATERAL     Patient Active Problem List   Diagnosis Date Noted  . Sepsis ([place]) 05/18/2017  . Syncope and collapse 07/25/2015  . HTN (hypertension) 07/25/2015  . Anxiety 07/25/2015  . Alzheimer's dementia 07/25/2015  . Inflammatory arthritis 07/25/2015  . Dry mouth 07/25/2015      Prior to Admission medications   Medication Sig Start Date End Date Taking? Authorizing Provider  amLODipine (NORVASC) 5 MG tablet Take 5 mg by mouth daily.   Yes [provider]  antiseptic oral rinse (BIOTENE) LIQD 15 mLs by Mouth Rinse route at bedtime.   Yes [provider]  aspirin EC 81 MG tablet Take 81 mg by mouth at bedtime.   Yes [provider]  calcium citrate (CALCITRATE - DOSED IN MG ELEMENTAL CALCIUM) 950 MG tablet Take 200  mg of elemental calcium by mouth daily.   Yes [provider]  cephALEXin (KEFLEX) 500 MG capsule Take 1 capsule (500 mg total) by mouth every 8 (eight) hours. 05/20/17 05/25/17 Yes Mody, Ulice Bold, MD  cimetidine (TAGAMET) 300 MG tablet Take 300 mg by mouth 2 (two) times daily.   Yes [provider]  clonazePAM (KLONOPIN) 1 MG tablet Take 1 mg by mouth at bedtime.    Yes [provider]  clotrimazole (MYCELEX) 10 MG troche Take 10 mg by mouth 3 (three) times daily. Pt takes 1 tab with every meal   Yes [provider]  Melatonin 5 MG TABS Take 1 tablet by mouth at bedtime.   Yes [provider]  memantine (NAMENDA) 10 MG tablet Take 10 mg by mouth 2 (two) times daily.   Yes [provider]  mirtazapine (REMERON) 7.5 MG tablet Take 7.5 mg by mouth at bedtime.   Yes [provider]  OLANZapine (ZYPREXA) 2.5 MG tablet Take 1.25 mg by mouth 2 (two) times daily.   Yes [provider]  predniSONE (DELTASONE) 5 MG tablet Take 5 mg by mouth daily with breakfast.   Yes [provider]  triamcinolone (NASACORT) 55 MCG/ACT AERO nasal inhaler Place 1 spray into the nose at bedtime. 04/25/17  Yes [provider]    Allergies Celecoxib and Hydrocodone  Social History Social History  Substance Use Topics  . Smoking status: Never Smoker  . Smokeless tobacco: Never Used  . Alcohol use No    Review of Systems Patient denies headaches, rhinorrhea, blurry vision, numbness, shortness of breath, chest pain, edema, cough, abdominal pain, nausea, vomiting, diarrhea, dysuria, fevers, rashes or hallucinations unless otherwise stated above in HPI. ____________________________________________   PHYSICAL EXAM:  VITAL SIGNS: Vitals:   05/24/17 1849 05/24/17 1900  BP: (!) 156/69 (!) 146/68  Pulse: 85 83  Resp: 16 16  Temp:      Constitutional: Alert and oriented.  in no acute distress. Eyes: Conjunctivae are normal.    Head: Atraumatic. Nose: No congestion/rhinnorhea. Mouth/Throat: Mucous membranes are moist.   Neck: No stridor. Painless ROM.  Cardiovascular: Normal rate, regular rhythm. Grossly normal heart sounds.  Good peripheral circulation. Respiratory: Normal respiratory effort.  No retractions. Lungs CTAB. Gastrointestinal: Soft and nontender. No distention. No abdominal bruits. No CVA tenderness. Musculoskeletal: No lower extremity tenderness, + BLE edema.  No joint effusions. Neurologic:  Normal speech and language. No gross focal neurologic deficits are appreciated. No facial droop Skin:  Skin is warm, dry and intact. No rash noted. Psychiatric: Mood and affect are normal. Speech and behavior are normal.  ____________________________________________   LABS (all labs ordered are listed, but only abnormal results are displayed)  Results for orders placed or performed during the hospital encounter of 05/24/17 (from the past 24 hour(s))  CBC     Status: Abnormal   Collection Time: 05/24/17  4:48 PM  Result Value Ref Range   WBC 8.9 3.6 - 11.0 K/uL   RBC 3.54 (L) 3.80 - 5.20 MIL/uL   Hemoglobin 10.9 (L) 12.0 - 16.0 g/dL   HCT 31.5 (L) 35.0 - 47.0 %   MCV 88.9 80.0 - 100.0 fL   MCH 30.6 26.0 - 34.0 pg   MCHC 34.4 32.0 - 36.0 g/dL   RDW 14.1 11.5 - 14.5 %   Platelets 383 150 - 440 K/uL  Basic metabolic panel     Status: Abnormal   Collection Time: 05/24/17  4:48 PM  Result Value Ref Range   Sodium 133 (L) 135 - 145 mmol/L   Potassium 3.8 3.5 - 5.1 mmol/L   Chloride 100 (L) 101 - 111 mmol/L   CO2 23 22 - 32 mmol/L   Glucose, Bld 171 (H) 65 - 99 mg/dL   BUN 18 6 - 20 mg/dL   Creatinine, Ser 1.20 (H) 0.44 - 1.00 mg/dL   Calcium 8.7 (L) 8.9 - 10.3 mg/dL   GFR calc non Af Amer 42 (L) >60 mL/min   GFR calc Af Amer 48 (L) >60 mL/min   Anion gap 10 5 - 15  Brain natriuretic peptide     Status: Abnormal   Collection Time: 05/24/17  4:48 PM  Result Value Ref Range   B Natriuretic Peptide  113.0 (H) 0.0 - 100.0 pg/mL   ____________________________________________  EKG My review and personal interpretation at Time: 18:49   Indication: swelling  Rate: 85 Rhythm: sinus Axis: normal Other: normal intervals, no STEMI ____________________________________________  RADIOLOGY  I personally reviewed all radiographic images ordered to evaluate for the above acute complaints and reviewed radiology reports and findings.  These findings were personally discussed with the patient.  Please see medical record for radiology report.  ____________________________________________   PROCEDURES  Procedure(s) performed:  Procedures    Critical Care performed: no ____________________________________________   INITIAL IMPRESSION / ASSESSMENT AND PLAN /  ED COURSE  Pertinent labs & imaging results that were available during my care of the patient were reviewed by me and considered in my medical decision making (see chart for details).  DDX: edema, chf, anasarca, dvt  Kelsey Morrison is a 83 y.o. who presents to the ED with lower extremity edema as described above. Radiographic and laboratory testing shows no evidence of acute congestive heart failure. Patient is good distal perfusion. Ultrasound without any evidence of DVT. Patient has no evidence of ACS. Possibly secondary to medication side effect as she is on amlodipine. Patient given a single dose of Lasix here in the ER (prescribed compression stockings with referral to the heart failure clinic for lower extremity edema and history of hypertension.  Have discussed with the patient and available family all diagnostics and treatments performed thus far and all questions were answered to the best of my ability. The patient demonstrates understanding and agreement with plan.       ____________________________________________   FINAL CLINICAL IMPRESSION(S) / ED DIAGNOSES  Final diagnoses:  Bilateral lower extremity edema       NEW MEDICATIONS STARTED DURING THIS VISIT:  New Prescriptions   No medications on file     Note:  This document was prepared using Dragon voice recognition software and may include unintentional dictation errors.    Merlyn Lot, MD 05/24/17 Lurena Nida

## 2017-05-24 NOTE — ED Notes (Signed)
Pt taken to US at this time

## 2017-05-24 NOTE — ED Notes (Signed)
Pt returned from [place], EKG obtained, pt placed on monitor. Pt's husband at bedside at this time. NAD noted at this time. Pt is noted to be anxious. VSS at this time.

## 2017-05-24 NOTE — ED Triage Notes (Signed)
Pt to triage via [place], report feet swelling starting this afternoon, 1+ pitting edema noted in ankles bilaterally.  Pt denies pain.  Pt denies hx of CHF or kidney disease.  Pt recently in hospital for bladder infection, d/c Tuesday, on Keflex at this time.  Respirations equal and unlabored.

## 2017-05-24 NOTE — ED Notes (Signed)
Pt assisted to the bathroom by this RN. Pt tolerated well.  

## 2018-03-30 ENCOUNTER — Other Ambulatory Visit: Payer: Self-pay | Admitting: Nurse Practitioner

## 2018-03-30 DIAGNOSIS — G301 Alzheimer's disease with late onset: Principal | ICD-10-CM

## 2018-03-30 DIAGNOSIS — F0280 Dementia in other diseases classified elsewhere without behavioral disturbance: Secondary | ICD-10-CM

## 2018-04-03 ENCOUNTER — Ambulatory Visit (HOSPITAL_COMMUNITY)
Admission: RE | Admit: 2018-04-03 | Discharge: 2018-04-03 | Disposition: A | Discharge status: Home / Self Care | Payer: Medicare Other | Source: Ambulatory Visit | Attending: Nurse Practitioner | Admitting: Nurse Practitioner

## 2018-04-03 DIAGNOSIS — G301 Alzheimer's disease with late onset: Secondary | ICD-10-CM | POA: Diagnosis present

## 2018-04-03 DIAGNOSIS — R410 Disorientation, unspecified: Secondary | ICD-10-CM | POA: Diagnosis not present

## 2018-04-03 DIAGNOSIS — G319 Degenerative disease of nervous system, unspecified: Secondary | ICD-10-CM | POA: Insufficient documentation

## 2018-04-03 DIAGNOSIS — F0280 Dementia in other diseases classified elsewhere without behavioral disturbance: Secondary | ICD-10-CM | POA: Diagnosis not present

## 2018-04-03 DIAGNOSIS — W19XXXA Unspecified fall, initial encounter: Secondary | ICD-10-CM | POA: Insufficient documentation

## 2018-04-03 DIAGNOSIS — Y92009 Unspecified place in unspecified non-institutional (private) residence as the place of occurrence of the external cause: Secondary | ICD-10-CM | POA: Insufficient documentation

## 2018-11-20 ENCOUNTER — Emergency Department
Admission: EM | Admit: 2018-11-20 | Discharge: 2018-11-20 | Disposition: A | Discharge status: Home / Self Care | Payer: Medicare Other | Attending: Emergency Medicine | Admitting: Emergency Medicine

## 2018-11-20 ENCOUNTER — Emergency Department: Payer: Medicare Other

## 2018-11-20 ENCOUNTER — Encounter: Payer: Self-pay | Admitting: Emergency Medicine

## 2018-11-20 DIAGNOSIS — N390 Urinary tract infection, site not specified: Secondary | ICD-10-CM

## 2018-11-20 DIAGNOSIS — Y939 Activity, unspecified: Secondary | ICD-10-CM | POA: Diagnosis not present

## 2018-11-20 DIAGNOSIS — Z23 Encounter for immunization: Secondary | ICD-10-CM | POA: Diagnosis not present

## 2018-11-20 DIAGNOSIS — Y929 Unspecified place or not applicable: Secondary | ICD-10-CM | POA: Insufficient documentation

## 2018-11-20 DIAGNOSIS — Z853 Personal history of malignant neoplasm of breast: Secondary | ICD-10-CM | POA: Diagnosis not present

## 2018-11-20 DIAGNOSIS — S0101XA Laceration without foreign body of scalp, initial encounter: Secondary | ICD-10-CM | POA: Insufficient documentation

## 2018-11-20 DIAGNOSIS — Z96653 Presence of artificial knee joint, bilateral: Secondary | ICD-10-CM | POA: Diagnosis not present

## 2018-11-20 DIAGNOSIS — Z79899 Other long term (current) drug therapy: Secondary | ICD-10-CM | POA: Diagnosis not present

## 2018-11-20 DIAGNOSIS — W19XXXA Unspecified fall, initial encounter: Secondary | ICD-10-CM | POA: Insufficient documentation

## 2018-11-20 DIAGNOSIS — I10 Essential (primary) hypertension: Secondary | ICD-10-CM | POA: Insufficient documentation

## 2018-11-20 DIAGNOSIS — F0280 Dementia in other diseases classified elsewhere without behavioral disturbance: Secondary | ICD-10-CM | POA: Diagnosis not present

## 2018-11-20 DIAGNOSIS — S0990XA Unspecified injury of head, initial encounter: Secondary | ICD-10-CM | POA: Diagnosis present

## 2018-11-20 DIAGNOSIS — G308 Other Alzheimer's disease: Secondary | ICD-10-CM | POA: Insufficient documentation

## 2018-11-20 DIAGNOSIS — Y999 Unspecified external cause status: Secondary | ICD-10-CM | POA: Insufficient documentation

## 2018-11-20 DIAGNOSIS — Z7982 Long term (current) use of aspirin: Secondary | ICD-10-CM | POA: Diagnosis not present

## 2018-11-20 LAB — COMPREHENSIVE METABOLIC PANEL
ALBUMIN: 3.7 g/dL (ref 3.5–5.0)
ALT: 12 U/L (ref 0–44)
AST: 23 U/L (ref 15–41)
Alkaline Phosphatase: 141 U/L — ABNORMAL HIGH (ref 38–126)
Anion gap: 10 (ref 5–15)
BILIRUBIN TOTAL: 0.4 mg/dL (ref 0.3–1.2)
BUN: 23 mg/dL (ref 8–23)
CO2: 25 mmol/L (ref 22–32)
CREATININE: 1.18 mg/dL — AB (ref 0.44–1.00)
Calcium: 9.1 mg/dL (ref 8.9–10.3)
Chloride: 99 mmol/L (ref 98–111)
GFR calc Af Amer: 50 mL/min — ABNORMAL LOW (ref >60)
GFR calc non Af Amer: 43 mL/min — ABNORMAL LOW (ref >60)
Glucose, Bld: 113 mg/dL — ABNORMAL HIGH (ref 70–99)
Potassium: 4.1 mmol/L (ref 3.5–5.1)
SODIUM: 134 mmol/L — AB (ref 135–145)
TOTAL PROTEIN: 7.9 g/dL (ref 6.5–8.1)

## 2018-11-20 LAB — CBC WITH DIFFERENTIAL/PLATELET
ABS IMMATURE GRANULOCYTES: 0.05 10*3/uL (ref 0.00–0.07)
BASOS ABS: 0.1 10*3/uL (ref 0.0–0.1)
Basophils Relative: 1 %
Eosinophils Absolute: 0.1 10*3/uL (ref 0.0–0.5)
Eosinophils Relative: 1 %
HEMATOCRIT: 37.6 % (ref 36.0–46.0)
HEMOGLOBIN: 11.6 g/dL — AB (ref 12.0–15.0)
Immature Granulocytes: 1 %
LYMPHS ABS: 1.5 10*3/uL (ref 0.7–4.0)
LYMPHS PCT: 21 %
MCH: 26.2 pg (ref 26.0–34.0)
MCHC: 30.9 g/dL (ref 30.0–36.0)
MCV: 85.1 fL (ref 80.0–100.0)
Monocytes Absolute: 0.4 10*3/uL (ref 0.1–1.0)
Monocytes Relative: 5 %
NEUTROS ABS: 5.3 10*3/uL (ref 1.7–7.7)
NRBC: 0 % (ref 0.0–0.2)
Neutrophils Relative %: 71 %
Platelets: 323 10*3/uL (ref 150–400)
RBC: 4.42 MIL/uL (ref 3.87–5.11)
RDW: 14.9 % (ref 11.5–15.5)
WBC: 7.3 10*3/uL (ref 4.0–10.5)

## 2018-11-20 LAB — URINALYSIS, COMPLETE (UACMP) WITH MICROSCOPIC
BILIRUBIN URINE: NEGATIVE
Glucose, UA: NEGATIVE mg/dL
Ketones, ur: NEGATIVE mg/dL
Nitrite: NEGATIVE
PH: 7 (ref 5.0–8.0)
Protein, ur: 30 mg/dL — AB
SPECIFIC GRAVITY, URINE: 1.015 (ref 1.005–1.030)
WBC, UA: >50 WBC/hpf — ABNORMAL HIGH (ref 0–5)

## 2018-11-20 LAB — TROPONIN I
Troponin I: <0.03 ng/mL (ref <0.03)
Troponin I: <0.03 ng/mL (ref <0.03)

## 2018-11-20 MED ORDER — SODIUM CHLORIDE 0.9 % IV BOLUS
500.0000 mL | Freq: Once | INTRAVENOUS | Status: AC
Start: 2018-11-20 — End: 2018-11-20
  Administered 2018-11-20: 500 mL via INTRAVENOUS

## 2018-11-20 MED ORDER — LIDOCAINE-EPINEPHRINE 2 %-1:100000 IJ SOLN
20.0000 mL | Freq: Once | INTRAMUSCULAR | Status: AC
  Administered 2018-11-20: 20 mL via INTRADERMAL

## 2018-11-20 MED ORDER — LIDOCAINE-EPINEPHRINE 2 %-1:100000 IJ SOLN
INTRAMUSCULAR | Status: AC
  Filled 2018-11-20: qty 1

## 2018-11-20 MED ORDER — CEPHALEXIN 500 MG PO CAPS: 500.0000 mg | ORAL_CAPSULE | Freq: Three times a day (TID) | ORAL | 0 refills | Status: AC

## 2018-11-20 MED ORDER — TETANUS-DIPHTH-ACELL PERTUSSIS 5-2.5-18.5 LF-MCG/0.5 IM SUSP
0.5000 mL | Freq: Once | INTRAMUSCULAR | Status: AC
  Administered 2018-11-20: 0.5 mL via INTRAMUSCULAR
  Filled 2018-11-20: qty 0.5

## 2018-11-20 MED ORDER — SODIUM CHLORIDE 0.9 % IV SOLN
1.0000 g | Freq: Once | INTRAVENOUS | Status: AC
  Administered 2018-11-20: 1 g via INTRAVENOUS
  Filled 2018-11-20: qty 10

## 2018-11-20 NOTE — ED Notes (Signed)
Patient transported to CT 

## 2018-11-20 NOTE — ED Provider Notes (Signed)
[facility] Emergency Department Provider Note  ___________________________________________   First MD Initiated Contact with Patient 11/20/18 1748     (approximate)  I have reviewed the triage vital signs and the nursing notes.   HISTORY  Chief Complaint Fall   HPI Kelsey Morrison is a 84 y.o. female with a history of dementia was presented emergency department today with a fall.  The patient does not remember the circumstances of the fall.  She repeatedly asks if she is bleeding.  EMS reported 2 lacerations to her head.  Patient denies any pain at this time.  Denies any palpitations.   Past Medical History:  Diagnosis Date  . Alzheimer's dementia ([place])   . Anxiety   . Cancer [facility])    breast cancer  . Depression   . Dry mouth   . HLD (hyperlipidemia)   . Hypertension   . Inflammatory arthritis    seronegative  . Osteoarthritis     Patient Active Problem List   Diagnosis Date Noted  . Sepsis ([place]) 05/18/2017  . Syncope and collapse 07/25/2015  . HTN (hypertension) 07/25/2015  . Anxiety 07/25/2015  . Alzheimer's dementia ([place]) 07/25/2015  . Inflammatory arthritis 07/25/2015  . Dry mouth 07/25/2015    Past Surgical History:  Procedure Laterality Date  . ABDOMINAL HYSTERECTOMY    . APPENDECTOMY    . CHOLECYSTECTOMY    . MASTECTOMY Right 1990  . REPLACEMENT TOTAL KNEE BILATERAL      Prior to Admission medications   Medication Sig Start Date End Date Taking? Authorizing Provider  amLODipine (NORVASC) 5 MG tablet Take 5 mg by mouth daily.    [provider]  antiseptic oral rinse (BIOTENE) LIQD 15 mLs by Mouth Rinse route at bedtime.    [provider]  aspirin EC 81 MG tablet Take 81 mg by mouth at bedtime.    [provider]  calcium citrate (CALCITRATE - DOSED IN MG ELEMENTAL CALCIUM) 950 MG tablet Take 200 mg of elemental calcium by mouth daily.    [provider]  cimetidine (TAGAMET) 300 MG  tablet Take 300 mg by mouth 2 (two) times daily.    [provider]  clonazePAM (KLONOPIN) 1 MG tablet Take 1 mg by mouth at bedtime.     [provider]  clotrimazole (MYCELEX) 10 MG troche Take 10 mg by mouth 3 (three) times daily. Pt takes 1 tab with every meal    [provider]  Elastic Bandages & Supports (T.E.D. BELOW KNEE/S-REGULAR) MISC 2 Units by Does not apply route daily. 05/24/17   Merlyn Lot, MD  Melatonin 5 MG TABS Take 1 tablet by mouth at bedtime.    [provider]  memantine (NAMENDA) 10 MG tablet Take 10 mg by mouth 2 (two) times daily.    [provider]  mirtazapine (REMERON) 7.5 MG tablet Take 7.5 mg by mouth at bedtime.    [provider]  OLANZapine (ZYPREXA) 2.5 MG tablet Take 1.25 mg by mouth 2 (two) times daily.    [provider]  predniSONE (DELTASONE) 5 MG tablet Take 5 mg by mouth daily with breakfast.    [provider]  triamcinolone (NASACORT) 55 MCG/ACT AERO nasal inhaler Place 1 spray into the nose at bedtime. 04/25/17   [provider]    Allergies Celecoxib and Hydrocodone  Family History  Problem Relation Age of Onset  . Breast cancer Mother   . COPD Sister   . Breast cancer  Sister     Social History Social History   Tobacco Use  . Smoking status: Never Smoker  . Smokeless tobacco: Never Used  Substance Use Topics  . Alcohol use: No  . Drug use: No    Review of Systems  Constitutional: No fever/chills Eyes: No visual changes. ENT: No sore throat. Cardiovascular: Denies chest pain. Respiratory: Denies shortness of breath. Gastrointestinal: No abdominal pain.  No nausea, no vomiting.  No diarrhea.  No constipation. Genitourinary: Negative for dysuria. Musculoskeletal: Negative for back pain. Skin: Negative for rash. Neurological: Negative for headaches, focal weakness or numbness.   ____________________________________________   PHYSICAL  EXAM:  VITAL SIGNS: ED Triage Vitals  Enc Vitals Group     BP 11/20/18 1736 (!) 169/73     Pulse Rate 11/20/18 1738 83     Resp 11/20/18 1736 17     Temp 11/20/18 1738 98.3 F (36.8 C)     Temp Source 11/20/18 1738 Oral     SpO2 11/20/18 1738 98 %     Weight 11/20/18 1734 143 lb 4.8 oz (65 kg)     Height --      Head Circumference --      Peak Flow --      Pain Score --      Pain Loc --      Pain Edu? --      Excl. in [place]? --     Constitutional: Alert and oriented. Well appearing and in no acute distress. Eyes: Conjunctivae are normal.  Head: Laceration x2 to the occiput.  To the right side of the occiput, superiorly there is a 4 cm laceration to the adipose.  No active bleeding at this time.  Also with surrounding hematoma but no depression or bogginess.  To the left side of the occiput there is a 2 cm laceration to the adipose.  No active bleeding at this time.  Small surrounding cephalohematoma approximately 1-2 cm in diameter. Nose: No congestion/rhinnorhea. Mouth/Throat: Mucous membranes are moist.  Neck: No stridor.  No tenderness along the distribution of the cervical spine.  No deformity or step-off.  Patient ranging head neck freely. Cardiovascular: Normal rate, regular rhythm. Grossly normal heart sounds. Respiratory: Normal respiratory effort.  No retractions. Lungs CTAB. Gastrointestinal: Soft and nontender. No distention.  Musculoskeletal: No lower extremity tenderness nor edema.  No joint effusions.  No tenderness to palpation to the bilateral hips.  5 out of 5 strength to bilateral hips.  No limb shortening.  Mild tenderness palpation along the distribution of the left clavicle and acromion but with 5 out of 5 strength in bilateral upper extremities.  No tenderness to palpation along the chest wall.  No crepitus.  No tenderness along the thoracic nor lumbar spines. Neurologic:  Normal speech and language. No gross focal neurologic deficits are appreciated. Skin:  Skin  is warm, dry and intact. No rash noted. Psychiatric: Mood and affect are normal. Speech and behavior are normal.  ____________________________________________   LABS (all labs ordered are listed, but only abnormal results are displayed)  Labs Reviewed  CBC WITH DIFFERENTIAL/PLATELET - Abnormal; Notable for the following components:      Result Value   Hemoglobin 11.6 (*)    All other components within normal limits  COMPREHENSIVE METABOLIC PANEL - Abnormal; Notable for the following components:   Sodium 134 (*)    Glucose, Bld 113 (*)    Creatinine, Ser 1.18 (*)    Alkaline Phosphatase 141 (*)  GFR calc non Af Amer 43 (*)    GFR calc Af Amer 50 (*)    All other components within normal limits  URINALYSIS, COMPLETE (UACMP) WITH MICROSCOPIC - Abnormal; Notable for the following components:   Color, Urine YELLOW (*)    APPearance HAZY (*)    Hgb urine dipstick SMALL (*)    Protein, ur 30 (*)    Leukocytes, UA LARGE (*)    WBC, UA >50 (*)    Bacteria, UA RARE (*)    All other components within normal limits  URINE CULTURE  TROPONIN I  TROPONIN I   ____________________________________________  EKG  ED ECG REPORT I, Doran Stabler, the attending physician, personally viewed and interpreted this ECG.   Date: 11/20/2018  EKG Time: 1735  Rate: 84  Rhythm: normal sinus rhythm  Axis: Normal  Intervals:none  ST&T Change: No ST segment elevation or depression.  No abnormal T wave inversion.  ____________________________________________  RADIOLOGY  Chest x-ray with suboptimal inspiration with mild resulting atelectasis.  No other acute findings. ____________________________________________   PROCEDURES  Procedure(s) performed:    Marland KitchenMarland KitchenLaceration Repair Date/Time: 11/20/2018 6:47 PM Performed by: Orbie Pyo, MD Authorized by: Orbie Pyo, MD   Consent:    Consent obtained:  Emergent situation   Consent given by:  Patient   Risks  discussed:  Infection, pain, retained foreign body, poor cosmetic result and poor wound healing Anesthesia (see MAR for exact dosages):    Anesthesia method:  Local infiltration   Local anesthetic:  Lidocaine 1% WITH epi Laceration details:    Location:  Scalp   Scalp location:  Occipital   Length (cm):  4   Depth (mm):  3 Repair type:    Repair type:  Simple Exploration:    Hemostasis achieved with:  Direct pressure   Wound exploration: entire depth of wound probed and visualized     Contaminated: no   Treatment:    Area cleansed with:  Saline   Amount of cleaning:  Extensive   Irrigation solution:  Sterile saline   Irrigation method:  Pressure wash   Visualized foreign bodies/material removed: no   Skin repair:    Repair method:  Staples   Number of staples:  11 Approximation:    Approximation:  Close Post-procedure details:    Patient tolerance of procedure:  Tolerated well, no immediate complications .Marland KitchenLaceration Repair Date/Time: 11/20/2018 6:48 PM Performed by: Orbie Pyo, MD Authorized by: Orbie Pyo, MD   Consent:    Consent obtained:  Emergent situation   Consent given by:  Patient   Risks discussed:  Infection, pain, retained foreign body, poor cosmetic result and poor wound healing Anesthesia (see MAR for exact dosages):    Anesthesia method:  Local infiltration   Local anesthetic:  Lidocaine 1% WITH epi Laceration details:    Location:  Scalp   Scalp location:  Occipital   Length (cm):  2   Depth (mm):  3 Repair type:    Repair type:  Simple Exploration:    Hemostasis achieved with:  Direct pressure   Wound exploration: entire depth of wound probed and visualized     Contaminated: no   Treatment:    Area cleansed with:  Saline   Amount of cleaning:  Extensive   Irrigation solution:  Sterile saline   Visualized foreign bodies/material removed: no   Skin repair:    Repair method:  Staples   Number of staples:   3 Approximation:  Approximation:  Close Post-procedure details:    Patient tolerance of procedure:  Tolerated well, no immediate complications    Critical Care performed:   ____________________________________________   INITIAL IMPRESSION / ASSESSMENT AND PLAN / ED COURSE  Pertinent labs & imaging results that were available during my care of the patient were reviewed by me and considered in my medical decision making (see chart for details).  DDX: Fall, syncope, UTI, anemia, scalp lacerations, skull fracture, cephalohematoma, intracranial hemorrhage, cervical spine fracture As part of my medical decision making, I reviewed the following data within the [facility] Notes from prior ED visits  ----------------------------------------- 10:15 PM on 11/20/2018 -----------------------------------------  Patient's husband is now bedside says the patient is acting at her baseline mental status.  However, he is concerned that she may be dehydrated and said that she has passed out several times in the past resulting in falls.  He has not sure if she passed out this afternoon but says that he is suspicious this may have been the cause of her fall.  However, he did not note any unconsciousness this afternoon when responding to her fall.  Patient given IV fluids.  2- troponins.  Patient and family advised that staples may be removed in 1 week.  Patient to be discharged at this time.  Patient and family understanding the diagnosis well treatment plan willing to comply.  Likely mechanical fall or related to UTI.  Will place on antibiotics. ____________________________________________   FINAL CLINICAL IMPRESSION(S) / ED DIAGNOSES  Fall.  Scalp laceration.  UTI.  NEW MEDICATIONS STARTED DURING THIS VISIT:  New Prescriptions   No medications on file     Note:  This document was prepared using Dragon voice recognition software and may include unintentional dictation  errors.     Orbie Pyo, MD 11/20/18 2216

## 2018-11-20 NOTE — ED Notes (Signed)
Patient assisted with bedpan.

## 2018-11-20 NOTE — ED Triage Notes (Signed)
Pt to ED via EMS from home after fall today unwitnessed, per EMS pt feeling weak 2-3 days, does have dementia, EMS reports no blood thinners.  Laceration to posterior head with bleeding controlled.  Presents alert, speaking in complete and coherent sentences, and in NAD at this time.  MD at bedside.

## 2018-11-23 LAB — URINE CULTURE: Culture: >=100000 — AB

## 2018-12-21 ENCOUNTER — Emergency Department: Payer: Medicare Other

## 2018-12-21 ENCOUNTER — Inpatient Hospital Stay
Admission: EM | Admit: 2018-12-21 | Discharge: 2018-12-26 | DRG: 312 | Disposition: A | Discharge status: Skilled Nursing Facility | Payer: Medicare Other | Attending: Internal Medicine | Admitting: Internal Medicine

## 2018-12-21 ENCOUNTER — Encounter: Payer: Self-pay | Admitting: Intensive Care

## 2018-12-21 ENCOUNTER — Other Ambulatory Visit: Payer: Self-pay

## 2018-12-21 DIAGNOSIS — I10 Essential (primary) hypertension: Secondary | ICD-10-CM | POA: Diagnosis present

## 2018-12-21 DIAGNOSIS — Z803 Family history of malignant neoplasm of breast: Secondary | ICD-10-CM

## 2018-12-21 DIAGNOSIS — Z66 Do not resuscitate: Secondary | ICD-10-CM | POA: Diagnosis present

## 2018-12-21 DIAGNOSIS — F0280 Dementia in other diseases classified elsewhere without behavioral disturbance: Secondary | ICD-10-CM | POA: Diagnosis present

## 2018-12-21 DIAGNOSIS — Z96653 Presence of artificial knee joint, bilateral: Secondary | ICD-10-CM | POA: Diagnosis present

## 2018-12-21 DIAGNOSIS — I480 Paroxysmal atrial fibrillation: Secondary | ICD-10-CM | POA: Diagnosis present

## 2018-12-21 DIAGNOSIS — I951 Orthostatic hypotension: Principal | ICD-10-CM | POA: Diagnosis present

## 2018-12-21 DIAGNOSIS — Z881 Allergy status to other antibiotic agents status: Secondary | ICD-10-CM

## 2018-12-21 DIAGNOSIS — M0600 Rheumatoid arthritis without rheumatoid factor, unspecified site: Secondary | ICD-10-CM | POA: Diagnosis present

## 2018-12-21 DIAGNOSIS — Z7952 Long term (current) use of systemic steroids: Secondary | ICD-10-CM

## 2018-12-21 DIAGNOSIS — Z90710 Acquired absence of both cervix and uterus: Secondary | ICD-10-CM

## 2018-12-21 DIAGNOSIS — Z9049 Acquired absence of other specified parts of digestive tract: Secondary | ICD-10-CM

## 2018-12-21 DIAGNOSIS — G309 Alzheimer's disease, unspecified: Secondary | ICD-10-CM | POA: Diagnosis present

## 2018-12-21 DIAGNOSIS — R296 Repeated falls: Secondary | ICD-10-CM | POA: Diagnosis present

## 2018-12-21 DIAGNOSIS — Z79890 Hormone replacement therapy (postmenopausal): Secondary | ICD-10-CM

## 2018-12-21 DIAGNOSIS — Z9011 Acquired absence of right breast and nipple: Secondary | ICD-10-CM

## 2018-12-21 DIAGNOSIS — R531 Weakness: Secondary | ICD-10-CM | POA: Diagnosis not present

## 2018-12-21 DIAGNOSIS — Z825 Family history of asthma and other chronic lower respiratory diseases: Secondary | ICD-10-CM

## 2018-12-21 DIAGNOSIS — Z79899 Other long term (current) drug therapy: Secondary | ICD-10-CM

## 2018-12-21 DIAGNOSIS — E785 Hyperlipidemia, unspecified: Secondary | ICD-10-CM | POA: Diagnosis present

## 2018-12-21 DIAGNOSIS — Z853 Personal history of malignant neoplasm of breast: Secondary | ICD-10-CM

## 2018-12-21 DIAGNOSIS — I4891 Unspecified atrial fibrillation: Secondary | ICD-10-CM | POA: Diagnosis present

## 2018-12-21 DIAGNOSIS — Z9181 History of falling: Secondary | ICD-10-CM

## 2018-12-21 DIAGNOSIS — F418 Other specified anxiety disorders: Secondary | ICD-10-CM | POA: Diagnosis present

## 2018-12-21 DIAGNOSIS — Z885 Allergy status to narcotic agent status: Secondary | ICD-10-CM

## 2018-12-21 DIAGNOSIS — Z7982 Long term (current) use of aspirin: Secondary | ICD-10-CM

## 2018-12-21 LAB — URINALYSIS, COMPLETE (UACMP) WITH MICROSCOPIC
BILIRUBIN URINE: NEGATIVE
Bacteria, UA: NONE SEEN
Glucose, UA: NEGATIVE mg/dL
Ketones, ur: NEGATIVE mg/dL
Nitrite: NEGATIVE
Protein, ur: NEGATIVE mg/dL
SPECIFIC GRAVITY, URINE: 1.015 (ref 1.005–1.030)
pH: 6 (ref 5.0–8.0)

## 2018-12-21 LAB — COMPREHENSIVE METABOLIC PANEL
ALBUMIN: 3.8 g/dL (ref 3.5–5.0)
ALK PHOS: 151 U/L — AB (ref 38–126)
ALT: 11 U/L (ref 0–44)
AST: 19 U/L (ref 15–41)
Anion gap: 10 (ref 5–15)
BUN: 28 mg/dL — ABNORMAL HIGH (ref 8–23)
CO2: 23 mmol/L (ref 22–32)
Calcium: 9.2 mg/dL (ref 8.9–10.3)
Chloride: 102 mmol/L (ref 98–111)
Creatinine, Ser: 0.94 mg/dL (ref 0.44–1.00)
GFR calc Af Amer: >60 mL/min (ref >60)
GFR calc non Af Amer: 56 mL/min — ABNORMAL LOW (ref >60)
Glucose, Bld: 109 mg/dL — ABNORMAL HIGH (ref 70–99)
Potassium: 3.8 mmol/L (ref 3.5–5.1)
Sodium: 135 mmol/L (ref 135–145)
Total Bilirubin: 0.4 mg/dL (ref 0.3–1.2)
Total Protein: 7.7 g/dL (ref 6.5–8.1)

## 2018-12-21 LAB — CBC WITH DIFFERENTIAL/PLATELET
Abs Immature Granulocytes: 0.04 10*3/uL (ref 0.00–0.07)
Basophils Absolute: 0.1 10*3/uL (ref 0.0–0.1)
Basophils Relative: 1 %
Eosinophils Absolute: 0.1 10*3/uL (ref 0.0–0.5)
Eosinophils Relative: 1 %
HCT: 37.7 % (ref 36.0–46.0)
Hemoglobin: 11.6 g/dL — ABNORMAL LOW (ref 12.0–15.0)
Immature Granulocytes: 1 %
Lymphocytes Relative: 21 %
Lymphs Abs: 1.7 10*3/uL (ref 0.7–4.0)
MCH: 26.4 pg (ref 26.0–34.0)
MCHC: 30.8 g/dL (ref 30.0–36.0)
MCV: 85.9 fL (ref 80.0–100.0)
MONOS PCT: 6 %
Monocytes Absolute: 0.5 10*3/uL (ref 0.1–1.0)
Neutro Abs: 5.6 10*3/uL (ref 1.7–7.7)
Neutrophils Relative %: 70 %
Platelets: 310 10*3/uL (ref 150–400)
RBC: 4.39 MIL/uL (ref 3.87–5.11)
RDW: 15 % (ref 11.5–15.5)
WBC: 8 10*3/uL (ref 4.0–10.5)
nRBC: 0 % (ref 0.0–0.2)

## 2018-12-21 LAB — TSH
TSH: 2.56 u[IU]/mL (ref 0.350–4.500)
TSH: 1.776 u[IU]/mL (ref 0.350–4.500)

## 2018-12-21 LAB — TROPONIN I
Troponin I: <0.03 ng/mL (ref <0.03)
Troponin I: 0.04 ng/mL (ref <0.03)
Troponin I: 0.04 ng/mL (ref <0.03)

## 2018-12-21 MED ORDER — SODIUM CHLORIDE 0.9 % IV BOLUS
1000.0000 mL | Freq: Once | INTRAVENOUS | Status: AC
  Administered 2018-12-21: 1000 mL via INTRAVENOUS

## 2018-12-21 MED ORDER — CLONAZEPAM 1 MG PO TABS
1.0000 mg | ORAL_TABLET | Freq: Every day | ORAL | Status: DC
  Administered 2018-12-21 – 2018-12-25 (×5): 1 mg via ORAL
  Filled 2018-12-21 (×5): qty 1

## 2018-12-21 MED ORDER — DILTIAZEM HCL ER COATED BEADS 120 MG PO CP24
120.0000 mg | ORAL_CAPSULE | Freq: Once | ORAL | Status: AC
  Administered 2018-12-21: 120 mg via ORAL
  Filled 2018-12-21: qty 1

## 2018-12-21 MED ORDER — CLOTRIMAZOLE 10 MG MT TROC
10.0000 mg | Freq: Three times a day (TID) | OROMUCOSAL | Status: DC
  Administered 2018-12-21 – 2018-12-26 (×11): 10 mg via ORAL
  Filled 2018-12-21 (×15): qty 1

## 2018-12-21 MED ORDER — BIOTENE DRY MOUTH MT LIQD
15.0000 mL | Freq: Every day | OROMUCOSAL | Status: DC
  Filled 2018-12-21: qty 15

## 2018-12-21 MED ORDER — SODIUM CHLORIDE 0.9 % IV SOLN
INTRAVENOUS | Status: DC
  Administered 2018-12-21: 17:00:00 via INTRAVENOUS

## 2018-12-21 MED ORDER — MIRTAZAPINE 15 MG PO TABS
7.5000 mg | ORAL_TABLET | Freq: Every day | ORAL | Status: DC
  Administered 2018-12-21 – 2018-12-23 (×3): 7.5 mg via ORAL
  Filled 2018-12-21 (×3): qty 1

## 2018-12-21 MED ORDER — ACETAMINOPHEN 325 MG PO TABS
650.0000 mg | ORAL_TABLET | Freq: Four times a day (QID) | ORAL | Status: DC | PRN
  Administered 2018-12-21 – 2018-12-25 (×3): 650 mg via ORAL
  Filled 2018-12-21 (×3): qty 2

## 2018-12-21 MED ORDER — ONDANSETRON HCL 4 MG/2ML IJ SOLN: 4.0000 mg | Freq: Four times a day (QID) | INTRAMUSCULAR | Status: DC | PRN

## 2018-12-21 MED ORDER — ONDANSETRON HCL 4 MG PO TABS: 4.0000 mg | ORAL_TABLET | Freq: Four times a day (QID) | ORAL | Status: DC | PRN

## 2018-12-21 MED ORDER — ASPIRIN EC 81 MG PO TBEC
81.0000 mg | DELAYED_RELEASE_TABLET | Freq: Every day | ORAL | Status: DC
  Administered 2018-12-21 – 2018-12-25 (×5): 81 mg via ORAL
  Filled 2018-12-21 (×5): qty 1

## 2018-12-21 MED ORDER — FLUTICASONE PROPIONATE 50 MCG/ACT NA SUSP
2.0000 | Freq: Every day | NASAL | Status: DC
  Administered 2018-12-23 – 2018-12-25 (×3): 2 via NASAL
  Filled 2018-12-21: qty 16

## 2018-12-21 MED ORDER — OLANZAPINE 2.5 MG PO TABS
1.2500 mg | ORAL_TABLET | Freq: Two times a day (BID) | ORAL | Status: DC
  Administered 2018-12-22 – 2018-12-26 (×9): 1.25 mg via ORAL
  Filled 2018-12-21 (×12): qty 0.5

## 2018-12-21 MED ORDER — MEMANTINE HCL 10 MG PO TABS
10.0000 mg | ORAL_TABLET | Freq: Two times a day (BID) | ORAL | Status: DC
  Administered 2018-12-21 – 2018-12-26 (×10): 10 mg via ORAL
  Filled 2018-12-21 (×11): qty 1

## 2018-12-21 MED ORDER — CALCIUM CITRATE 950 (200 CA) MG PO TABS
200.0000 mg | ORAL_TABLET | Freq: Every day | ORAL | Status: DC
  Administered 2018-12-21 – 2018-12-26 (×6): 200 mg via ORAL
  Filled 2018-12-21 (×6): qty 1

## 2018-12-21 MED ORDER — PREDNISONE 10 MG PO TABS
5.0000 mg | ORAL_TABLET | Freq: Every day | ORAL | Status: DC
  Filled 2018-12-21 (×2): qty 1

## 2018-12-21 MED ORDER — ACETAMINOPHEN 650 MG RE SUPP: 650.0000 mg | Freq: Four times a day (QID) | RECTAL | Status: DC | PRN

## 2018-12-21 MED ORDER — DILTIAZEM HCL 25 MG/5ML IV SOLN
10.0000 mg | Freq: Once | INTRAVENOUS | Status: AC
  Administered 2018-12-21: 10 mg via INTRAVENOUS
  Filled 2018-12-21: qty 5

## 2018-12-21 MED ORDER — ENOXAPARIN SODIUM 40 MG/0.4ML ~~LOC~~ SOLN
40.0000 mg | SUBCUTANEOUS | Status: DC
  Administered 2018-12-21 – 2018-12-25 (×5): 40 mg via SUBCUTANEOUS
  Filled 2018-12-21 (×5): qty 0.4

## 2018-12-21 MED ORDER — T.E.D. BELOW KNEE/S-REGULAR MISC: 2.0000 [IU] | Freq: Every day | Status: DC

## 2018-12-21 MED ORDER — DILTIAZEM HCL-DEXTROSE 100-5 MG/100ML-% IV SOLN (PREMIX)
5.0000 mg/h | INTRAVENOUS | Status: DC
  Filled 2018-12-21: qty 100

## 2018-12-21 MED ORDER — FAMOTIDINE 20 MG PO TABS
20.0000 mg | ORAL_TABLET | Freq: Every day | ORAL | Status: DC
  Administered 2018-12-21 – 2018-12-26 (×6): 20 mg via ORAL
  Filled 2018-12-21 (×6): qty 1

## 2018-12-21 MED ORDER — AMLODIPINE BESYLATE 5 MG PO TABS
5.0000 mg | ORAL_TABLET | Freq: Every day | ORAL | Status: DC
  Administered 2018-12-21: 5 mg via ORAL
  Filled 2018-12-21 (×2): qty 1

## 2018-12-21 MED ORDER — MELATONIN 5 MG PO TABS
1.0000 | ORAL_TABLET | Freq: Every day | ORAL | Status: DC
  Administered 2018-12-21 – 2018-12-25 (×5): 5 mg via ORAL
  Filled 2018-12-21 (×6): qty 1

## 2018-12-21 MED ORDER — TRIAMCINOLONE ACETONIDE 55 MCG/ACT NA AERO: 1.0000 | INHALATION_SPRAY | Freq: Every day | NASAL | Status: DC

## 2018-12-21 NOTE — ED Triage Notes (Signed)
Patient arrived from home by EMS for X3 falls in the past week due to weakness. Patient unable to stand longer than 10 seconds for EMS before having to sit back down. Patient Denies pain. HX Alz and HTN. Alert to self, situation, and place. Disoriented to time. EMS vitals CBG 143, 138/80, HR 140s

## 2018-12-21 NOTE — H&P (Signed)
[facility] NAME: Kelsey Morrison    MR#:  [id number]  DATE OF BIRTH:  January 29, 1934  DATE OF ADMISSION:  12/21/2018  PRIMARY CARE PHYSICIAN: Maryland Pink, MD   REQUESTING/REFERRING PHYSICIAN: Dr. Conni Slipper  CHIEF COMPLAINT:   Chief Complaint  Patient presents with  . Weakness  . Fall    HISTORY OF PRESENT ILLNESS:  Kelsey Morrison  is a 85 y.o. female with a known history of Alzheimer's dementia, depression, hypertension, arthritis was brought in from by family secondary to significant weakness and inability to get out of bed today. Patient is unable to contribute to the history due to her dementia.  She is only oriented to self.  Most of the history is obtained from her husband at bedside.  According to the husband, patient has been falling in the last 2 days.  No syncopal episodes.  She is very unsteady on her feet.  Today she could not even get out of bed and he could not carry her so called ambulance to get to the emergency room.  Patient denies any pain at this time.  Noted to be orthostatically hypotensive here in the emergency room and was in A. fib and she, converted to sinus rhythm after 1 dose of Cardizem.  Urine analysis negative for infection.  She is being admitted under observation for her weakness.  PAST MEDICAL HISTORY:   Past Medical History:  Diagnosis Date  . Alzheimer's dementia ([place])   . Anxiety   . Cancer [facility])    breast cancer  . Depression   . Dry mouth   . HLD (hyperlipidemia)   . Hypertension   . Inflammatory arthritis    seronegative  . Osteoarthritis     PAST SURGICAL HISTORY:   Past Surgical History:  Procedure Laterality Date  . ABDOMINAL HYSTERECTOMY    . APPENDECTOMY    . CHOLECYSTECTOMY    . MASTECTOMY Right 1990  . REPLACEMENT TOTAL KNEE BILATERAL      SOCIAL HISTORY:   Social History   Tobacco Use  . Smoking status: Never Smoker  . Smokeless tobacco: Never Used    Substance Use Topics  . Alcohol use: No    FAMILY HISTORY:   Family History  Problem Relation Age of Onset  . Breast cancer Mother   . COPD Sister   . Breast cancer Sister     DRUG ALLERGIES:   Allergies  Allergen Reactions  . Celecoxib Other (See Comments)  . Hydrocodone Other (See Comments)    Altered mental status    REVIEW OF SYSTEMS:   Review of Systems  Constitutional: Positive for malaise/fatigue. Negative for chills, fever and weight loss.  HENT: Negative for ear discharge, ear pain, hearing loss, nosebleeds and tinnitus.   Eyes: Negative for blurred vision, double vision and photophobia.  Respiratory: Negative for cough, hemoptysis, shortness of breath and wheezing.   Cardiovascular: Negative for chest pain, palpitations, orthopnea and leg swelling.  Gastrointestinal: Negative for abdominal pain, constipation, diarrhea, heartburn, melena, nausea and vomiting.  Genitourinary: Negative for dysuria, frequency, hematuria and urgency.  Musculoskeletal: Positive for falls. Negative for back pain, myalgias and neck pain.  Skin: Negative for rash.  Neurological: Negative for dizziness, tingling, tremors, sensory change, speech change, focal weakness and headaches.  Endo/Heme/Allergies: Does not bruise/bleed easily.  Psychiatric/Behavioral: Negative for depression.    MEDICATIONS AT HOME:   Prior to Admission medications   Medication Sig Start Date End Date Taking?  Authorizing Provider  amLODipine (NORVASC) 5 MG tablet Take 5 mg by mouth daily.    [provider]  antiseptic oral rinse (BIOTENE) LIQD 15 mLs by Mouth Rinse route at bedtime.    [provider]  aspirin EC 81 MG tablet Take 81 mg by mouth at bedtime.    [provider]  calcium citrate (CALCITRATE - DOSED IN MG ELEMENTAL CALCIUM) 950 MG tablet Take 200 mg of elemental calcium by mouth daily.    [provider]  cimetidine (TAGAMET) 300 MG tablet Take 300 mg by mouth 2  (two) times daily.    [provider]  clonazePAM (KLONOPIN) 1 MG tablet Take 1 mg by mouth at bedtime.     [provider]  clotrimazole (MYCELEX) 10 MG troche Take 10 mg by mouth 3 (three) times daily. Pt takes 1 tab with every meal    [provider]  Elastic Bandages & Supports (T.E.D. BELOW KNEE/S-REGULAR) MISC 2 Units by Does not apply route daily. 05/24/17   Merlyn Lot, MD  Melatonin 5 MG TABS Take 1 tablet by mouth at bedtime.    [provider]  memantine (NAMENDA) 10 MG tablet Take 10 mg by mouth 2 (two) times daily.    [provider]  mirtazapine (REMERON) 7.5 MG tablet Take 7.5 mg by mouth at bedtime.    [provider]  OLANZapine (ZYPREXA) 2.5 MG tablet Take 1.25 mg by mouth 2 (two) times daily.    [provider]  predniSONE (DELTASONE) 5 MG tablet Take 5 mg by mouth daily with breakfast.    [provider]  triamcinolone (NASACORT) 55 MCG/ACT AERO nasal inhaler Place 1 spray into the nose at bedtime. 04/25/17   [provider]      VITAL SIGNS:  Blood pressure (!) 180/77, pulse 79, temperature (!) 97.5 F (36.4 C), temperature source Oral, resp. rate 16, height 5\' 6"  (1.676 m), weight 65.8 kg, SpO2 98 %.  PHYSICAL EXAMINATION:   Physical Exam  GENERAL:  85 y.o.-year-old elderly patient lying in the bed with no acute distress.  EYES: Pupils equal, round, reactive to light and accommodation. No scleral icterus. Extraocular muscles intact.  HEENT: Head atraumatic, normocephalic. Oropharynx and nasopharynx clear.  NECK:  Supple, no jugular venous distention. No thyroid enlargement, no tenderness.  LUNGS: Normal breath sounds bilaterally, no wheezing, rales,rhonchi or crepitation. No use of accessory muscles of respiration.  CARDIOVASCULAR: S1, S2 normal. No  rubs, or gallops.  2/6 systolic murmur is present ABDOMEN: Soft, nontender, nondistended. Bowel sounds present. No organomegaly or mass.   EXTREMITIES: No pedal edema, cyanosis, or clubbing.  NEUROLOGIC: Cranial nerves II through XII are intact. Muscle strength 5/5 in all extremities. Sensation intact. Gait not checked.  PSYCHIATRIC: The patient is alert and oriented to self  SKIN: No obvious rash, lesion, or ulcer.  Bruising noted around the knees  LABORATORY PANEL:   CBC Recent Labs  Lab 12/21/18 1034  WBC 8.0  HGB 11.6*  HCT 37.7  PLT 310   ------------------------------------------------------------------------------------------------------------------  Chemistries  Recent Labs  Lab 12/21/18 1034  NA 135  K 3.8  CL 102  CO2 23  GLUCOSE 109*  BUN 28*  CREATININE 0.94  CALCIUM 9.2  AST 19  ALT 11  ALKPHOS 151*  BILITOT 0.4   ------------------------------------------------------------------------------------------------------------------  Cardiac Enzymes Recent Labs  Lab 12/21/18 1034  TROPONINI <0.03   ------------------------------------------------------------------------------------------------------------------  RADIOLOGY:  Ct Head Wo Contrast  Result Date: 12/21/2018 CLINICAL DATA:  Multiple falls EXAM: CT HEAD WITHOUT CONTRAST TECHNIQUE: Contiguous axial images were obtained from the base of the skull through the vertex without intravenous contrast. COMPARISON:  11/20/2018 FINDINGS: Brain: There is atrophy and chronic small vessel disease changes. No acute intracranial abnormality. Specifically, no hemorrhage, hydrocephalus, mass lesion, acute infarction, or significant intracranial injury. Vascular: No hyperdense vessel or unexpected calcification. Skull: No acute calvarial abnormality. Sinuses/Orbits: Visualized paranasal sinuses and mastoids clear. Orbital soft tissues unremarkable. Other: None IMPRESSION: Atrophy, chronic microvascular disease. No acute intracranial abnormality. Electronically Signed   By: Rolm Baptise M.D.   On: 12/21/2018 10:55   Dg Chest Portable 1 View  Result Date:  12/21/2018 CLINICAL DATA:  Three falls over the last week due to weakness. History of hypertension. EXAM: PORTABLE CHEST 1 VIEW COMPARISON:  11/20/2018 and 05/24/2017 radiographs. FINDINGS: 1054 hours. Improved aeration of the lung bases. The lungs are clear. The heart size and mediastinal contours are stable with mild aortic atherosclerosis. There is no pleural effusion or pneumothorax. Asymmetric degenerative changes are again noted at the left shoulder joint. IMPRESSION: No active cardiopulmonary process. Electronically Signed   By: Richardean Sale M.D.   On: 12/21/2018 11:09    EKG:   Orders placed or performed during the hospital encounter of 12/21/18  . EKG 12-Lead  . EKG 12-Lead  . ED EKG  . ED EKG    IMPRESSION AND PLAN:   Kelsey Morrison  is a 85 y.o. female with a known history of Alzheimer's dementia, depression, hypertension, arthritis was brought in from by family secondary to significant weakness and inability to get out of bed today.  1.  Falls and weakness-admit under observation, physical therapy consult -Check orthostatics.  IV fluids -Urine culture sent  2.  Dementia-seems to be pleasantly confused.  At baseline -Continue her home medications  3.  Depression anxiety-continue home medications  4.  DVT prophylaxis-Lovenox  Physical therapy consulted  All the records are reviewed and case discussed with ED provider. Management plans discussed with the patient, family and they are in agreement.  CODE STATUS: Full Code  TOTAL TIME TAKING CARE OF THIS PATIENT: 51 minutes.    Gladstone Lighter M.D on 12/21/2018 at 4:24 PM  Between 7am to 6pm - Pager - [phone]  After 6pm go to [email or web address] - password [email or web address] [facility]  Office  [phone]  CC: Primary care physician; Maryland Pink, MD

## 2018-12-21 NOTE — Care Management Obs Status (Signed)
[place] NOTIFICATION   Patient Details  Name: Kelsey Morrison MRN: [id number] Date of Birth: 07/07/1934   Medicare Observation Status Notification Given:  Yes    Shelbie Hutching, RN 12/21/2018, 4:23 PM

## 2018-12-21 NOTE — ED Provider Notes (Signed)
[facility] Emergency Department Provider Note   ____________________________________________   First MD Initiated Contact with Patient 12/21/18 1022     (approximate)  I have reviewed the triage vital signs and the nursing notes.   HISTORY  Chief Complaint Weakness and Fall    HPI Kelsey Morrison is a 85 y.o. female who has fallen 3 times in the last week.  EMS reports when they got there and stood her up she started falling backwards.  Patient herself says she is not having any pain she is not having any numbness she denies any fever chills nausea vomiting diarrhea everything is fine.  Past Medical History:  Diagnosis Date  . Alzheimer's dementia ([place])   . Anxiety   . Cancer [facility])    breast cancer  . Depression   . Dry mouth   . HLD (hyperlipidemia)   . Hypertension   . Inflammatory arthritis    seronegative  . Osteoarthritis     Patient Active Problem List   Diagnosis Date Noted  . Sepsis ([place]) 05/18/2017  . Syncope and collapse 07/25/2015  . HTN (hypertension) 07/25/2015  . Anxiety 07/25/2015  . Alzheimer's dementia ([place]) 07/25/2015  . Inflammatory arthritis 07/25/2015  . Dry mouth 07/25/2015    Past Surgical History:  Procedure Laterality Date  . ABDOMINAL HYSTERECTOMY    . APPENDECTOMY    . CHOLECYSTECTOMY    . MASTECTOMY Right 1990  . REPLACEMENT TOTAL KNEE BILATERAL      Prior to Admission medications   Medication Sig Start Date End Date Taking? Authorizing Provider  amLODipine (NORVASC) 5 MG tablet Take 5 mg by mouth daily.    [provider]  antiseptic oral rinse (BIOTENE) LIQD 15 mLs by Mouth Rinse route at bedtime.    [provider]  aspirin EC 81 MG tablet Take 81 mg by mouth at bedtime.    [provider]  calcium citrate (CALCITRATE - DOSED IN MG ELEMENTAL CALCIUM) 950 MG tablet Take 200 mg of elemental calcium by mouth daily.    [provider]  cimetidine (TAGAMET) 300 MG  tablet Take 300 mg by mouth 2 (two) times daily.    [provider]  clonazePAM (KLONOPIN) 1 MG tablet Take 1 mg by mouth at bedtime.     [provider]  clotrimazole (MYCELEX) 10 MG troche Take 10 mg by mouth 3 (three) times daily. Pt takes 1 tab with every meal    [provider]  Elastic Bandages & Supports (T.E.D. BELOW KNEE/S-REGULAR) MISC 2 Units by Does not apply route daily. 05/24/17   Merlyn Lot, MD  Melatonin 5 MG TABS Take 1 tablet by mouth at bedtime.    [provider]  memantine (NAMENDA) 10 MG tablet Take 10 mg by mouth 2 (two) times daily.    [provider]  mirtazapine (REMERON) 7.5 MG tablet Take 7.5 mg by mouth at bedtime.    [provider]  OLANZapine (ZYPREXA) 2.5 MG tablet Take 1.25 mg by mouth 2 (two) times daily.    [provider]  predniSONE (DELTASONE) 5 MG tablet Take 5 mg by mouth daily with breakfast.    [provider]  triamcinolone (NASACORT) 55 MCG/ACT AERO nasal inhaler Place 1 spray into the nose at bedtime. 04/25/17   [provider]    Allergies Celecoxib and Hydrocodone  Family History  Problem Relation Age of Onset  . Breast cancer Mother   . COPD Sister   .  Breast cancer Sister     Social History Social History   Tobacco Use  . Smoking status: Never Smoker  . Smokeless tobacco: Never Used  Substance Use Topics  . Alcohol use: No  . Drug use: No    Review of Systems  Constitutional: No fever/chills Eyes: No visual changes. ENT: No sore throat. Cardiovascular: Denies chest pain. Respiratory: Denies shortness of breath. Gastrointestinal: No abdominal pain.  No nausea, no vomiting.  No diarrhea.  No constipation. Genitourinary: Negative for dysuria. Musculoskeletal: Negative for back pain. Skin: Negative for rash. Neurological: Negative for headaches, focal weakness   ____________________________________________   PHYSICAL EXAM:  VITAL  SIGNS: ED Triage Vitals  Enc Vitals Group     BP 12/21/18 1025 124/62     Pulse Rate 12/21/18 1025 (!) 140     Resp 12/21/18 1025 16     Temp --      Temp src --      SpO2 12/21/18 1025 98 %     Weight 12/21/18 1026 145 lb (65.8 kg)     Height 12/21/18 1026 5\' 6"  (1.676 m)     Head Circumference --      Peak Flow --      Pain Score 12/21/18 1026 0     Pain Loc --      Pain Edu? --      Excl. in [place]? --     Constitutional: Alert and oriented. Well appearing and in no acute distress. Eyes: Conjunctivae are normal. PERRL. EOMI. Head: Atraumatic. Nose: No congestion/rhinnorhea. Mouth/Throat: Mucous membranes are moist.  Oropharynx non-erythematous. Neck: No stridor. Cardiovascular: Normal rate, regular rhythm. Grossly normal heart sounds.  Good peripheral circulation. Respiratory: Normal respiratory effort.  No retractions. Lungs CTAB. Gastrointestinal: Soft and nontender. No distention. No abdominal bruits. No CVA tenderness. Musculoskeletal: No lower extremity tenderness nor edema.  No joint effusions. Neurologic:  Normal speech and language. No gross focal neurologic deficits are appreciated. No gait instability. Skin:  Skin is warm, dry and intact. No rash noted. Psychiatric: Mood and affect are normal. Speech and behavior are normal.  ____________________________________________   LABS (all labs ordered are listed, but only abnormal results are displayed)  Labs Reviewed  URINALYSIS, COMPLETE (UACMP) WITH MICROSCOPIC - Abnormal; Notable for the following components:      Result Value   Color, Urine YELLOW (*)    APPearance CLEAR (*)    Hgb urine dipstick SMALL (*)    Leukocytes, UA SMALL (*)    All other components within normal limits  COMPREHENSIVE METABOLIC PANEL - Abnormal; Notable for the following components:   Glucose, Bld 109 (*)    BUN 28 (*)    Alkaline Phosphatase 151 (*)    GFR calc non Af Amer 56 (*)    All other components within normal limits  CBC  WITH DIFFERENTIAL/PLATELET - Abnormal; Notable for the following components:   Hemoglobin 11.6 (*)    All other components within normal limits  TROPONIN I  TSH   ____________________________________________  EKG  EKG read interpreted by me shows A. fib at a rate of 140 left axis no acute ST-T changes _________________ EKG #2 after Cardizem shows sinus rhythm at 82 left axis no acute ST-T wave changes ___________________________  RADIOLOGY  ED MD interpretation: CT of the chest read by radiology reviewed by me shows no acute disease CT of the head the same.  Official radiology report(s): Ct Head Wo Contrast  Result Date: 12/21/2018 CLINICAL DATA:  Multiple falls EXAM: CT HEAD WITHOUT CONTRAST TECHNIQUE: Contiguous axial images were obtained from the base of the skull through the vertex without intravenous contrast. COMPARISON:  11/20/2018 FINDINGS: Brain: There is atrophy and chronic small vessel disease changes. No acute intracranial abnormality. Specifically, no hemorrhage, hydrocephalus, mass lesion, acute infarction, or significant intracranial injury. Vascular: No hyperdense vessel or unexpected calcification. Skull: No acute calvarial abnormality. Sinuses/Orbits: Visualized paranasal sinuses and mastoids clear. Orbital soft tissues unremarkable. Other: None IMPRESSION: Atrophy, chronic microvascular disease. No acute intracranial abnormality. Electronically Signed   By: Rolm Baptise M.D.   On: 12/21/2018 10:55   Dg Chest Portable 1 View  Result Date: 12/21/2018 CLINICAL DATA:  Three falls over the last week due to weakness. History of hypertension. EXAM: PORTABLE CHEST 1 VIEW COMPARISON:  11/20/2018 and 05/24/2017 radiographs. FINDINGS: 1054 hours. Improved aeration of the lung bases. The lungs are clear. The heart size and mediastinal contours are stable with mild aortic atherosclerosis. There is no pleural effusion or pneumothorax. Asymmetric degenerative changes are again noted at  the left shoulder joint. IMPRESSION: No active cardiopulmonary process. Electronically Signed   By: Richardean Sale M.D.   On: 12/21/2018 11:09    ____________________________________________   PROCEDURES  Procedure(s) performed: Patient is orthostatic blood pressure falls to 91 when she stands.  We will give her some fluids and try to repeat it and see how she does.  She has no longer in A. fib  Procedures  Critical Care performed:   ____________________________________________   INITIAL IMPRESSION / ASSESSMENT AND PLAN / ED COURSE  Husband comes in.  He reports as far as he knows there is no history of A. fib or irregular heart rate.  She is just falling a lot here lately.  She is very weak  ----------------------------------------- 3:31 PM on 12/21/2018 -----------------------------------------  Patient's A. fib has resolved patient still weak she is still orthostatic in spite of a liter fluid.  Pressure laying down is now 449 systolic blood pressure standing up is only 108 and she still lightheaded.  We will get her in the hospital to see if we can optimize her.  I do not want her to go home and fall down again.     ____________________________________________   FINAL CLINICAL IMPRESSION(S) / ED DIAGNOSES  Final diagnoses:  Weakness  Intermittent atrial fibrillation ([place])  Orthostatic hypotension     ED Discharge Orders    None       Note:  This document was prepared using Dragon voice recognition software and may include unintentional dictation errors.    Nena Polio, MD 12/21/18 1531

## 2018-12-21 NOTE — Progress Notes (Signed)
CRITICAL VALUE ALERT  Critical Value: troponin 0.04  Date & Time Notied:  12/21/2018 18:14  Provider Notified: Tressia Miners Orders Received/Actions taken: MD aware, continue to monitor

## 2018-12-21 NOTE — ED Notes (Signed)
Patient ambulated with walker 6 steps before having to sit down in chair due to weakness

## 2018-12-22 ENCOUNTER — Observation Stay
Admit: 2018-12-22 | Discharge: 2018-12-22 | Disposition: A | Discharge status: Home / Self Care | Payer: Medicare Other | Attending: Internal Medicine | Admitting: Internal Medicine

## 2018-12-22 LAB — BASIC METABOLIC PANEL
Anion gap: 8 (ref 5–15)
BUN: 17 mg/dL (ref 8–23)
CO2: 21 mmol/L — ABNORMAL LOW (ref 22–32)
Calcium: 8.8 mg/dL — ABNORMAL LOW (ref 8.9–10.3)
Chloride: 107 mmol/L (ref 98–111)
Creatinine, Ser: 0.65 mg/dL (ref 0.44–1.00)
GFR calc non Af Amer: >60 mL/min (ref >60)
Glucose, Bld: 84 mg/dL (ref 70–99)
Potassium: 3.4 mmol/L — ABNORMAL LOW (ref 3.5–5.1)
SODIUM: 136 mmol/L (ref 135–145)

## 2018-12-22 LAB — CBC
HCT: 35 % — ABNORMAL LOW (ref 36.0–46.0)
Hemoglobin: 10.7 g/dL — ABNORMAL LOW (ref 12.0–15.0)
MCH: 26.1 pg (ref 26.0–34.0)
MCHC: 30.6 g/dL (ref 30.0–36.0)
MCV: 85.4 fL (ref 80.0–100.0)
Platelets: 286 10*3/uL (ref 150–400)
RBC: 4.1 MIL/uL (ref 3.87–5.11)
RDW: 15 % (ref 11.5–15.5)
WBC: 5.6 10*3/uL (ref 4.0–10.5)
nRBC: 0 % (ref 0.0–0.2)

## 2018-12-22 LAB — ECHOCARDIOGRAM COMPLETE
Height: 66 in
Weight: 2320 oz

## 2018-12-22 LAB — TROPONIN I: Troponin I: 0.03 ng/mL (ref <0.03)

## 2018-12-22 MED ORDER — DILTIAZEM HCL ER COATED BEADS 120 MG PO CP24: 120.0000 mg | ORAL_CAPSULE | Freq: Every day | ORAL | 0 refills | Status: DC

## 2018-12-22 MED ORDER — POTASSIUM CHLORIDE CRYS ER 20 MEQ PO TBCR
40.0000 meq | EXTENDED_RELEASE_TABLET | Freq: Once | ORAL | Status: AC
  Administered 2018-12-22: 40 meq via ORAL
  Filled 2018-12-22: qty 2

## 2018-12-22 MED ORDER — DILTIAZEM HCL ER COATED BEADS 120 MG PO CP24
120.0000 mg | ORAL_CAPSULE | Freq: Every day | ORAL | Status: DC
  Administered 2018-12-22 – 2018-12-26 (×5): 120 mg via ORAL
  Filled 2018-12-22 (×5): qty 1

## 2018-12-22 NOTE — Progress Notes (Signed)
Advance care planning  Purpose of Encounter A. fib, weakness  Parties in Attendance Patient, husband at bedside  Patients Decisional capacity Patient has advanced dementia and unable to participate in medical discussions or make decisions.  Husband at bedside is her designated healthcare power of attorney.  As documented healthcare power of attorney and advanced directives per husband.  We discussed regarding patient's atrial fibrillation presently normal sinus rhythm.  Weakness and physical therapy evaluation.  Treatment plan and prognosis.  CODE STATUS discussed.  Husband tells me that patient should be a DO NOT RESUSCITATE and DO NOT INTUBATE.  Orders entered.  CODE STATUS changed.  We also discussed regarding patient's discharge home with home health plan.  Husband is very concerned.  Unfortunately cannot go to skilled nursing facility.  Echocardiogram pending at this point.  Will likely be discharged home tomorrow.   DNR/DNI   Time spent - 17 minutes

## 2018-12-22 NOTE — Progress Notes (Signed)
*  PRELIMINARY RESULTS* Echocardiogram 2D Echocardiogram has been performed.  Kelsey Morrison 12/22/2018, 2:15 PM

## 2018-12-22 NOTE — Progress Notes (Signed)
[facility] [place] NAME: Kelsey Morrison    MR#:  [id number]  DATE OF BIRTH:  12-Sep-1934  SUBJECTIVE:  CHIEF COMPLAINT:   Chief Complaint  Patient presents with  . Weakness  . Fall   Laying in bed.  Confused.  Husband at bedside. Converted normal sinus rhythm in the emergency room.  Presently heart rate in the 70s.  REVIEW OF SYSTEMS:    Review of Systems  Unable to perform ROS: Dementia    DRUG ALLERGIES:   Allergies  Allergen Reactions  . Celecoxib Other (See Comments)  . Hydrocodone Other (See Comments)    Altered mental status    VITALS:  Blood pressure 111/64, pulse 71, temperature 98.4 F (36.9 C), temperature source Oral, resp. rate 18, height 5\' 6"  (1.676 m), weight 65.8 kg, SpO2 99 %.  PHYSICAL EXAMINATION:   Physical Exam  GENERAL:  85 y.o.-year-old patient lying in the bed with no acute distress.  EYES: Pupils equal, round, reactive to light and accommodation. No scleral icterus. Extraocular muscles intact.  HEENT: Head atraumatic, normocephalic. Oropharynx and nasopharynx clear.  NECK:  Supple, no jugular venous distention. No thyroid enlargement, no tenderness.  LUNGS: Normal breath sounds bilaterally, no wheezing, rales, rhonchi. No use of accessory muscles of respiration.  CARDIOVASCULAR: S1, S2 normal. No murmurs, rubs, or gallops.  ABDOMEN: Soft, nontender, nondistended. Bowel sounds present. No organomegaly or mass.  EXTREMITIES: No cyanosis, clubbing or edema b/l.    NEUROLOGIC: Cranial nerves II through XII are intact. No focal Motor or sensory deficits b/l.   PSYCHIATRIC: The patient is alert and awake.  Confused SKIN: No obvious rash, lesion, or ulcer.   LABORATORY PANEL:   CBC Recent Labs  Lab 12/22/18 0442  WBC 5.6  HGB 10.7*  HCT 35.0*  PLT 286   ------------------------------------------------------------------------------------------------------------------ Chemistries  Recent Labs   Lab 12/21/18 1034 12/22/18 0442  NA 135 136  K 3.8 3.4*  CL 102 107  CO2 23 21*  GLUCOSE 109* 84  BUN 28* 17  CREATININE 0.94 0.65  CALCIUM 9.2 8.8*  AST 19  --   ALT 11  --   ALKPHOS 151*  --   BILITOT 0.4  --    ------------------------------------------------------------------------------------------------------------------  Cardiac Enzymes Recent Labs  Lab 12/22/18 0442  TROPONINI 0.03*   ------------------------------------------------------------------------------------------------------------------  RADIOLOGY:  Ct Head Wo Contrast  Result Date: 12/21/2018 CLINICAL DATA:  Multiple falls EXAM: CT HEAD WITHOUT CONTRAST TECHNIQUE: Contiguous axial images were obtained from the base of the skull through the vertex without intravenous contrast. COMPARISON:  11/20/2018 FINDINGS: Brain: There is atrophy and chronic small vessel disease changes. No acute intracranial abnormality. Specifically, no hemorrhage, hydrocephalus, mass lesion, acute infarction, or significant intracranial injury. Vascular: No hyperdense vessel or unexpected calcification. Skull: No acute calvarial abnormality. Sinuses/Orbits: Visualized paranasal sinuses and mastoids clear. Orbital soft tissues unremarkable. Other: None IMPRESSION: Atrophy, chronic microvascular disease. No acute intracranial abnormality. Electronically Signed   By: Rolm Baptise M.D.   On: 12/21/2018 10:55   Dg Chest Portable 1 View  Result Date: 12/21/2018 CLINICAL DATA:  Three falls over the last week due to weakness. History of hypertension. EXAM: PORTABLE CHEST 1 VIEW COMPARISON:  11/20/2018 and 05/24/2017 radiographs. FINDINGS: 1054 hours. Improved aeration of the lung bases. The lungs are clear. The heart size and mediastinal contours are stable with mild aortic atherosclerosis. There is no pleural effusion or pneumothorax. Asymmetric degenerative changes are again noted at the left shoulder joint.  IMPRESSION: No active cardiopulmonary  process. Electronically Signed   By: Richardean Sale M.D.   On: 12/21/2018 11:09     ASSESSMENT AND PLAN:   Kelsey Morrison  is a 85 y.o. female with a known history of Alzheimer's dementia, depression, hypertension, arthritis was brought in from by family secondary to significant weakness and inability to get out of bed today.  *Paroxysmal atrial fibrillation.  Converted to normal sinus rhythm with 1 dose of IV Cardizem.  We will start her on oral Cardizem daily.  Echocardiogram pending.  TSH normal.  No chest pain.  Troponins stayed normal.  Will need outpatient follow-up.  *  Falls and weakness Likely due to progressive decline with dementia.  But with physical therapy.  Extremely weak.  Continue physical therapy Skilled nursing facility recommended but unfortunately insurance would not approve skilled nursing facility.  Family unable to pay out of pocket.  Last resort will be home with home health services.  Will likely continue to decline with her dementia.  *  Dementia-seems to be pleasantly confused.   Monitor for inpatient delirium  *  Depression anxiety-continue home medications  *  DVT prophylaxis-Lovenox  Physical therapy consulted  All the records are reviewed and case discussed with Care Management/Social Workerr. Management plans discussed with the patient, family and they are in agreement.  CODE STATUS: DO NOT RESUSCITATE and DO NOT INTUBATE  DVT Prophylaxis: SCDs  TOTAL TIME TAKING CARE OF THIS PATIENT: 30 minutes.   POSSIBLE D/C IN 1-2 DAYS, DEPENDING ON CLINICAL CONDITION.  Neita Carp M.D on 12/22/2018 at 12:54 PM  Between 7am to 6pm - Pager - [phone]  After 6pm go to [email or web address] - password [email or web address] [name]  Office  [phone]  CC: Primary care physician; [place] Pink, MD  Note: This dictation was prepared with Dragon dictation along with smaller phrase technology. Any transcriptional errors that result from this  process are unintentional.

## 2018-12-22 NOTE — Care Management Note (Signed)
Case Management Note  Patient Details  Name: Kelsey Morrison MRN: [id number] Date of Birth: 1934/09/08  Subjective/Objective:     Patient is from home with husband.  Placed in observation with frequent falls and increased weakness.  Patient fell in early December and needed 8 stitches to back of head.  She has advanced Alzheimer's; husband states she doesn't know who he is most of the time.   He is not comfortable taking her home with her frequent falls and Alzheimer's.  Does not meet SNF requirements and patient's husband cannot pay out of pocket for SNF.  Not in the [employer] registry.  Offered choice for home health services.  He chose [facility].  Referral made for SN, PT, OT, SW and aide.  Accepted by Corene Cornea with advanced.  She has a walker and a cane at home.  Denies difficulties obtaining medications or accessing medical care.  Provided list of private duty assistants.  No further needs identified at this time.                Action/Plan:   Expected Discharge Date:                  Expected Discharge Plan:  [place]  In-House Referral:     Discharge planning Services  CM Consult  Post Acute Care Choice:  Home Health Choice offered to:  Spouse  DME Arranged:    DME Agency:     HH Arranged:  RN, OT, PT, Nurse's Aide, Social Work [employer] Agency:  [facility]  Status of Service:  Completed, signed off  If discussed at [employer], dates discussed:    Additional Comments:  Elza Rafter, RN 12/22/2018, 12:06 PM

## 2018-12-22 NOTE — Clinical Social Work Note (Addendum)
CSW was informed by PT that they are recommending SNF placement however patient is under observation status verse inpatient.  CSW spoke to patient's husband who was at bedside, and informed him that because patient does not fall under inpatient status, she will not be able to use SNF benefits unless he is able to private pay.  Patient's husband stated that he can not afford to private pay for patient to go to SNF.  CSW informed patient's husband that if she had a different managed Medicare plan, then it would not make a difference if is observation or inpatient.  CSW informed him that open enrollment will be in November, and that maybe he can consider changing plans.  CSW explained to patient that the only other option unfortunately is to go home with home health.  CSW told patient's husband, patient can receive home health PT, OT, Nursing, aide, and social worker.  CSW informed patient that if he is looking for different placement option like ALF or long term SNF, the home health social worker can assist with helping patient, but he is still going to have to private pay.  CSW explained that sometimes patient's will go to SNF, and the family has to pay privately, then if approved by Medicaid it can be reimburse.  Patient's husband expressed understanding.  CSW updated case manager, who is aware that patient will need home health.  Jones Broom. [place], MSW, [place]  12/22/2018 12:14 PM

## 2018-12-22 NOTE — Discharge Instructions (Addendum)
Weakness °Weakness is a lack of strength. You may feel weak all over your body (generalized), or you may feel weak in one specific part of your body (focal). Common causes of weakness include: °· Infection and immune system disorders. °· Physical exhaustion. °· Internal bleeding or other blood loss that results in a lack of red blood cells (anemia). °· Dehydration. °· An imbalance in mineral (electrolyte) levels, such as potassium. °· Heart disease, circulation problems, or stroke. °Other causes include: °· Some medicines or cancer treatment. °· Stress, anxiety, or depression. °· Nervous system disorders. °· Thyroid disorders. °· Loss of muscle strength because of age or inactivity. °· Poor sleep quality or sleep disorders. °The cause of your weakness may not be known. Some causes of weakness can be serious, so it is important to see your health care provider. °Follow these instructions at home: °Activity °· Rest as needed. °· Try to get enough sleep. Most adults need 7-8 hours of quality sleep each night. Talk to your health care provider about how much sleep you need each night. °· Do exercises, such as arm curls and leg raises, for 30 minutes at least 2 days a week or as told by your health care provider. This helps build muscle strength. °· Consider working with a physical therapist or trainer who can develop an exercise plan to help you gain muscle strength. °General instructions ° °· Take over-the-counter and prescription medicines only as told by your health care provider. °· Eat a healthy, well-balanced diet. This includes: °? Proteins to build muscles, such as lean meats and fish. °? Fresh fruits and vegetables. °? Carbohydrates to boost energy, such as whole grains. °· Drink enough fluid to keep your urine pale yellow. °· Keep all follow-up visits as told by your health care provider. This is important. °Contact a health care provider if your weakness: °· Does not improve or gets worse. °· Affects your  ability to think clearly. °· Affects your ability to do your normal daily activities. °Get help right away if you: °· Develop sudden weakness, especially on one side of your face or body. °· Have chest pain. °· Have trouble breathing or shortness of breath. °· Have problems with your vision. °· Have trouble talking or swallowing. °· Have trouble standing or walking. °· Are light-headed or lose consciousness. °Summary °· Weakness is a lack of strength. You may feel weak all over your body or just in one specific part of your body. °· Weakness can be caused by a variety of things. In some cases, the cause may be unknown. °· Rest as needed, and try to get enough sleep. Most adults need 7-8 hours of quality sleep each night. °· Eat a healthy, well-balanced diet. °This information is not intended to replace advice given to you by your health care provider. Make sure you discuss any questions you have with your health care provider. °Document Released: 11/25/2005 Document Revised: 07/01/2018 Document Reviewed: 07/01/2018 °Elsevier Interactive Patient Education © 2019 Elsevier Inc. ° °

## 2018-12-22 NOTE — Plan of Care (Signed)
Rounded with MD, pt resting in room comfortably, safety sitter at bedside, husband visiting, no complains of pain. Pt continues to be NRS on tele HR in the 70's. Will continue to monitor.  Problem: Education: Goal: Knowledge of General Education information will improve Description Including pain rating scale, medication(s)/side effects and non-pharmacologic comfort measures Outcome: Progressing   Problem: Health Behavior/Discharge Planning: Goal: Ability to manage health-related needs will improve Outcome: Progressing   Problem: Clinical Measurements: Goal: Ability to maintain clinical measurements within normal limits will improve Outcome: Progressing

## 2018-12-22 NOTE — Evaluation (Signed)
Physical Therapy Evaluation Patient Details Name: Kelsey Morrison MRN: [id number] DOB: 04-07-1934 Today's Date: 12/22/2018   History of Present Illness  85 y.o. female with a known history of Alzheimer's dementia, depression, hypertension, arthritis was brought in from by family secondary to significant weakness.  Clinical Impression  Pt pleasantly confused t/o PT exam, was able to participate with basic and simple instruction. She overall showed poor tolerance to standing with subjective c/o dizziness. Multiple standing attempts with cuing and education (pt and husband) t/o the PT session. Attempted to take orthostatics multiple times in standing but she could not tolerate it long enough. On final standing attempt she did read 99/55 after some time and multiple requests to sit down, (sitting BP was 111/64).  Pt unable to tolerate standing long enough to even take 2 steps, at this point she is not safe to go home and PT is recommending STR.      Follow Up Recommendations SNF(unsafe at home per today's assessment)    Equipment Recommendations       Recommendations for Other Services       Precautions / Restrictions Precautions Precautions: Fall Restrictions Weight Bearing Restrictions: No      Mobility  Bed Mobility Overal bed mobility: Needs Assistance Bed Mobility: Supine to Sit;Sit to Supine     Supine to sit: Mod assist Sit to supine: Mod assist   General bed mobility comments: Pt showed some effort in getting to/from EOB but ultimately needed assist with all tasks  Transfers Overall transfer level: Needs assistance Equipment used: Rolling walker (2 wheeled) Transfers: Sit to/from Stand Sit to Stand: Mod assist         General transfer comment: 3 standing attempts with pt only able to tolerate standing for <2 minutes each time due to feeling dizzy/unsteady.  She requested to sit back down quickly each time, stood long enough to get one standing BP (99/55) but poor  overall tolerance  Ambulation/Gait             General Gait Details: PT managed a few steps but ultimately had subjective dizziness that did not allow her to do much  Stairs            Wheelchair Mobility    Modified Rankin (Stroke Patients Only)       Balance Overall balance assessment: Needs assistance Sitting-balance support: Bilateral upper extremity supported;Feet supported Sitting balance-Leahy Scale: Fair       Standing balance-Leahy Scale: Poor                               Pertinent Vitals/Pain Pain Assessment: No/denies pain    Home Living Family/patient expects to be discharged to:: Unsure Living Arrangements: Spouse/significant other Available Help at Discharge: Family Type of Home: House Home Access: Stairs to enter   [employer] of Steps: 1   Home Equipment: Environmental consultant - 2 wheels;Cane - single point      Prior Function Level of Independence: Needs assistance   Gait / Transfers Assistance Needed: Pt apparently is very inconsistent with using ADs, has had numerous falls in the last few months     Comments: Pt gets out of home at least 1x/weeks, sometimes more     Hand Dominance        Extremity/Trunk Assessment   Upper Extremity Assessment Upper Extremity Assessment: Generalized weakness    Lower Extremity Assessment Lower Extremity Assessment: Generalized weakness  Communication   Communication: Expressive difficulties  Cognition Arousal/Alertness: Awake/alert Behavior During Therapy: Flat affect Overall Cognitive Status: History of cognitive impairments - at baseline                                        General Comments      Exercises     Assessment/Plan    PT Assessment Patient needs continued PT services  PT Problem List Decreased activity tolerance;Decreased range of motion;Decreased strength;Decreased balance;Decreased mobility;Decreased coordination;Decreased  cognition;Decreased knowledge of use of DME;Decreased knowledge of precautions;Decreased safety awareness;Cardiopulmonary status limiting activity       PT Treatment Interventions Gait training;DME instruction;Functional mobility training;Stair training;Therapeutic activities;Therapeutic exercise;Balance training;Neuromuscular re-education;Patient/family education;Cognitive remediation    PT Goals (Current goals can be found in the Care Plan section)  Acute Rehab PT Goals Patient Stated Goal: husband concerned about safety at home PT Goal Formulation: With family Time For Goal Achievement: 01/05/19 Potential to Achieve Goals: Fair    Frequency Min 2X/week   Barriers to discharge        Co-evaluation               AM-PAC PT "6 Clicks" Mobility  Outcome Measure Help needed turning from your back to your side while in a flat bed without using bedrails?: A Little Help needed moving from lying on your back to sitting on the side of a flat bed without using bedrails?: A Lot Help needed moving to and from a bed to a chair (including a wheelchair)?: A Lot Help needed standing up from a chair using your arms (e.g., wheelchair or bedside chair)?: A Little Help needed to walk in hospital room?: Total Help needed climbing 3-5 steps with a railing? : Total 6 Click Score: 12    End of Session Equipment Utilized During Treatment: Gait belt Activity Tolerance: Patient limited by fatigue Patient left: with family/visitor present;with bed alarm set;with call bell/phone within reach Nurse Communication: Mobility status PT Visit Diagnosis: Muscle weakness (generalized) (M62.81);Difficulty in walking, not elsewhere classified (R26.2);Dizziness and giddiness (R42)    Time: 8811-0315 PT Time Calculation (min) (ACUTE ONLY): 27 min   Charges:   PT Evaluation $PT Eval Low Complexity: 1 Low PT Treatments $Therapeutic Activity: 8-22 mins        Kreg Shropshire, DPT 12/22/2018, 1:20  PM

## 2018-12-23 DIAGNOSIS — F418 Other specified anxiety disorders: Secondary | ICD-10-CM | POA: Diagnosis present

## 2018-12-23 DIAGNOSIS — Z90710 Acquired absence of both cervix and uterus: Secondary | ICD-10-CM | POA: Diagnosis not present

## 2018-12-23 DIAGNOSIS — Z9181 History of falling: Secondary | ICD-10-CM | POA: Diagnosis not present

## 2018-12-23 DIAGNOSIS — Z9011 Acquired absence of right breast and nipple: Secondary | ICD-10-CM | POA: Diagnosis not present

## 2018-12-23 DIAGNOSIS — Z825 Family history of asthma and other chronic lower respiratory diseases: Secondary | ICD-10-CM | POA: Diagnosis not present

## 2018-12-23 DIAGNOSIS — I951 Orthostatic hypotension: Secondary | ICD-10-CM | POA: Diagnosis present

## 2018-12-23 DIAGNOSIS — I10 Essential (primary) hypertension: Secondary | ICD-10-CM | POA: Diagnosis present

## 2018-12-23 DIAGNOSIS — Z96653 Presence of artificial knee joint, bilateral: Secondary | ICD-10-CM | POA: Diagnosis present

## 2018-12-23 DIAGNOSIS — G309 Alzheimer's disease, unspecified: Secondary | ICD-10-CM | POA: Diagnosis present

## 2018-12-23 DIAGNOSIS — Z9049 Acquired absence of other specified parts of digestive tract: Secondary | ICD-10-CM | POA: Diagnosis not present

## 2018-12-23 DIAGNOSIS — E785 Hyperlipidemia, unspecified: Secondary | ICD-10-CM | POA: Diagnosis present

## 2018-12-23 DIAGNOSIS — I480 Paroxysmal atrial fibrillation: Secondary | ICD-10-CM | POA: Diagnosis present

## 2018-12-23 DIAGNOSIS — Z7982 Long term (current) use of aspirin: Secondary | ICD-10-CM | POA: Diagnosis not present

## 2018-12-23 DIAGNOSIS — F0280 Dementia in other diseases classified elsewhere without behavioral disturbance: Secondary | ICD-10-CM | POA: Diagnosis present

## 2018-12-23 DIAGNOSIS — R296 Repeated falls: Secondary | ICD-10-CM | POA: Diagnosis present

## 2018-12-23 DIAGNOSIS — Z803 Family history of malignant neoplasm of breast: Secondary | ICD-10-CM | POA: Diagnosis not present

## 2018-12-23 DIAGNOSIS — M0600 Rheumatoid arthritis without rheumatoid factor, unspecified site: Secondary | ICD-10-CM | POA: Diagnosis present

## 2018-12-23 DIAGNOSIS — R531 Weakness: Secondary | ICD-10-CM | POA: Diagnosis present

## 2018-12-23 DIAGNOSIS — Z885 Allergy status to narcotic agent status: Secondary | ICD-10-CM | POA: Diagnosis not present

## 2018-12-23 DIAGNOSIS — Z853 Personal history of malignant neoplasm of breast: Secondary | ICD-10-CM | POA: Diagnosis not present

## 2018-12-23 DIAGNOSIS — Z79890 Hormone replacement therapy (postmenopausal): Secondary | ICD-10-CM | POA: Diagnosis not present

## 2018-12-23 DIAGNOSIS — Z881 Allergy status to other antibiotic agents status: Secondary | ICD-10-CM | POA: Diagnosis not present

## 2018-12-23 DIAGNOSIS — Z66 Do not resuscitate: Secondary | ICD-10-CM | POA: Diagnosis present

## 2018-12-23 DIAGNOSIS — Z7952 Long term (current) use of systemic steroids: Secondary | ICD-10-CM | POA: Diagnosis not present

## 2018-12-23 DIAGNOSIS — Z79899 Other long term (current) drug therapy: Secondary | ICD-10-CM | POA: Diagnosis not present

## 2018-12-23 LAB — URINE CULTURE: Culture: NO GROWTH

## 2018-12-23 LAB — BASIC METABOLIC PANEL
Anion gap: 10 (ref 5–15)
BUN: 29 mg/dL — AB (ref 8–23)
CO2: 20 mmol/L — ABNORMAL LOW (ref 22–32)
Calcium: 8.9 mg/dL (ref 8.9–10.3)
Chloride: 104 mmol/L (ref 98–111)
Creatinine, Ser: 0.91 mg/dL (ref 0.44–1.00)
GFR calc Af Amer: >60 mL/min (ref >60)
GFR calc non Af Amer: 59 mL/min — ABNORMAL LOW (ref >60)
Glucose, Bld: 97 mg/dL (ref 70–99)
Potassium: 4.4 mmol/L (ref 3.5–5.1)
Sodium: 134 mmol/L — ABNORMAL LOW (ref 135–145)

## 2018-12-23 LAB — CORTISOL: Cortisol, Plasma: 14.9 ug/dL

## 2018-12-23 MED ORDER — SODIUM CHLORIDE 0.9 % IV BOLUS
250.0000 mL | Freq: Once | INTRAVENOUS | Status: AC
  Administered 2018-12-23: 250 mL via INTRAVENOUS

## 2018-12-23 MED ORDER — LACTATED RINGERS IV SOLN
INTRAVENOUS | Status: DC
  Administered 2018-12-23 – 2018-12-24 (×2): via INTRAVENOUS

## 2018-12-23 NOTE — Progress Notes (Signed)
[facility] [place] NAME: Nohelia Valenza    MR#:  [id number]  DATE OF BIRTH:  July 22, 1934  SUBJECTIVE:  CHIEF COMPLAINT:   Chief Complaint  Patient presents with  . Weakness  . Fall   Confused and weak.  Orthostatic with PT. And unable to walk  REVIEW OF SYSTEMS:    Review of Systems  Unable to perform ROS: Dementia   DRUG ALLERGIES:   Allergies  Allergen Reactions  . Celecoxib Other (See Comments)  . Hydrocodone Other (See Comments)    Altered mental status    VITALS:  Blood pressure 138/67, pulse 79, temperature 98.4 F (36.9 C), temperature source Oral, resp. rate 18, height 5\' 6"  (1.676 m), weight 65.8 kg, SpO2 100 %.  PHYSICAL EXAMINATION:   Physical Exam  GENERAL:  85 y.o.-year-old patient lying in the bed with no acute distress.  EYES: Pupils equal, round, reactive to light and accommodation. No scleral icterus. Extraocular muscles intact.  HEENT: Head atraumatic, normocephalic. Oropharynx and nasopharynx clear.  NECK:  Supple, no jugular venous distention. No thyroid enlargement, no tenderness.  LUNGS: Normal breath sounds bilaterally, no wheezing, rales, rhonchi. No use of accessory muscles of respiration.  CARDIOVASCULAR: S1, S2 normal. No murmurs, rubs, or gallops.  ABDOMEN: Soft, nontender, nondistended. Bowel sounds present. No organomegaly or mass.  EXTREMITIES: No cyanosis, clubbing or edema b/l.    NEUROLOGIC: Cranial nerves II through XII are intact. No focal Motor or sensory deficits b/l.   PSYCHIATRIC: The patient is alert and awake.  Confused SKIN: No obvious rash, lesion, or ulcer.   LABORATORY PANEL:   CBC Recent Labs  Lab 12/22/18 0442  WBC 5.6  HGB 10.7*  HCT 35.0*  PLT 286   ------------------------------------------------------------------------------------------------------------------ Chemistries  Recent Labs  Lab 12/21/18 1034 12/22/18 0442  NA 135 136  K 3.8 3.4*  CL 102 107   CO2 23 21*  GLUCOSE 109* 84  BUN 28* 17  CREATININE 0.94 0.65  CALCIUM 9.2 8.8*  AST 19  --   ALT 11  --   ALKPHOS 151*  --   BILITOT 0.4  --    ------------------------------------------------------------------------------------------------------------------  Cardiac Enzymes Recent Labs  Lab 12/22/18 0442  TROPONINI 0.03*   ------------------------------------------------------------------------------------------------------------------  RADIOLOGY:  No results found.   ASSESSMENT AND PLAN:   Amando Ishikawa  is a 85 y.o. female with a known history of Alzheimer's dementia, depression, hypertension, arthritis was brought in from by family secondary to significant weakness and inability to get out of bed today.  * orthostatic hypotension Check TSH, Cortisol levels. Bolus NS and continue IVF I suspect this will remain but hopefully improve. Added stockings.  *Paroxysmal atrial fibrillation.  Converted to normal sinus rhythm with 1 dose of IV Cardizem.  We will start her on oral Cardizem daily.  Echocardiogram - nothing acute.  TSH normal.  No chest pain.  Troponins stayed normal.  Will need outpatient follow-up. No anti coagulation due to risk of falls  *  Falls and weakness Likely due to progressive decline with dementia.  But with physical therapy,extremely weak.  Continue physical therapy Skilled nursing facility recommended .  *  Dementia-seems to be pleasantly confused.   Monitor for inpatient delirium  *  Depression anxiety-continue home medications  *  DVT prophylaxis-Lovenox  All the records are reviewed and case discussed with Care Management/Social Workerr. Management plans discussed with the patient, family and they are in agreement.  CODE STATUS: DO NOT RESUSCITATE  and DO NOT INTUBATE  TOTAL TIME TAKING CARE OF THIS PATIENT: 30 minutes.   POSSIBLE D/C IN 1-2 DAYS, DEPENDING ON CLINICAL CONDITION.  Leia Alf Keyden Pavlov M.D on 12/23/2018 at 3:59  PM  Between 7am to 6pm - Pager - [phone]  After 6pm go to [email or web address] - password [email or web address] [facility]  Office  [phone]  CC: Primary care physician; [place] Pink, MD  Note: This dictation was prepared with Dragon dictation along with smaller phrase technology. Any transcriptional errors that result from this process are unintentional.

## 2018-12-23 NOTE — Progress Notes (Signed)
Patient no longer needs PIV started RN already achieved PIV

## 2018-12-23 NOTE — NC FL2 (Signed)
[place] LEVEL OF CARE SCREENING TOOL     IDENTIFICATION  Patient Name: Kelsey Morrison Birthdate: 1934-07-09 Sex: female Admission Date (Current Location): 12/21/2018  [place] and [place] Number:  Engineering geologist and Address:  [facility], [address] [place], [place] [address]      Provider Number: [id number]  Attending Physician Name and Address:  Hillary Bow, MD  Relative Name and Phone Number:  Kelsey Morrison, Kelsey Morrison [phone] or Kelsey Morrison [phone]     Current Level of Care: Hospital Recommended Level of Care: [place] Prior Approval Number:    Date Approved/Denied:   PASRR Number: [id number]  Discharge Plan: SNF    Current Diagnoses: Patient Active Problem List   Diagnosis Date Noted  . Orthostatic hypotension 12/23/2018  . A-fib ([place]) 12/21/2018  . Sepsis ([place]) 05/18/2017  . Syncope and collapse 07/25/2015  . HTN (hypertension) 07/25/2015  . Anxiety 07/25/2015  . Alzheimer's dementia ([place]) 07/25/2015  . Inflammatory arthritis 07/25/2015  . Dry mouth 07/25/2015    Orientation RESPIRATION BLADDER Height & Weight     Self  Normal Incontinent Weight: 145 lb (65.8 kg) Height:  5\' 6"  (167.6 cm)  BEHAVIORAL SYMPTOMS/MOOD NEUROLOGICAL BOWEL NUTRITION STATUS      Continent Diet(Cardiac)  AMBULATORY STATUS COMMUNICATION OF NEEDS Skin   Limited Assist Verbally Normal                       Personal Care Assistance Level of Assistance  Bathing, Feeding, Dressing Bathing Assistance: Limited assistance Feeding assistance: Limited assistance Dressing Assistance: Limited assistance     Functional Limitations Info  Sight, Hearing, Speech Sight Info: Adequate Hearing Info: Adequate Speech Info: Adequate    SPECIAL CARE FACTORS FREQUENCY  PT (By licensed PT), OT (By licensed OT)     PT Frequency: 5x a week OT Frequency: 5x a week            Contractures  Contractures Info: Not present    Additional Factors Info  Code Status, Allergies, Psychotropic Code Status Info: DNR Allergies Info: CELECOXIB, HYDROCODONE  Psychotropic Info: mirtazapine (REMERON) tablet 7.5 mg or OLANZapine (ZYPREXA) tablet 1.25 mg          Current Medications (12/23/2018):  This is the current hospital active medication list Current Facility-Administered Medications  Medication Dose Route Frequency Provider Last Rate Last Dose  . acetaminophen (TYLENOL) tablet 650 mg  650 mg Oral Q6H PRN Gladstone Lighter, MD   650 mg at 12/22/18 1936   Or  . acetaminophen (TYLENOL) suppository 650 mg  650 mg Rectal Q6H PRN Gladstone Lighter, MD      . antiseptic oral rinse (BIOTENE) solution 15 mL  15 mL Mouth Rinse QHS Gladstone Lighter, MD      . aspirin EC tablet 81 mg  81 mg Oral QHS Gladstone Lighter, MD   81 mg at 12/22/18 1936  . calcium citrate (CALCITRATE - dosed in mg elemental calcium) tablet 200 mg of elemental calcium  200 mg of elemental calcium Oral Daily Gladstone Lighter, MD   200 mg of elemental calcium at 12/23/18 0915  . clonazePAM (KLONOPIN) tablet 1 mg  1 mg Oral QHS Gladstone Lighter, MD   1 mg at 12/22/18 1936  . clotrimazole (MYCELEX) troche 10 mg  10 mg Oral TID Gladstone Lighter, MD   10 mg at 12/22/18 2200  . diltiazem (CARDIZEM CD) 24 hr capsule 120 mg  120 mg Oral Daily Sudini,  Srikar, MD   120 mg at 12/23/18 0914  . diltiazem (CARDIZEM) 100 mg in dextrose 5% 141mL (1 mg/mL) infusion  5-15 mg/hr Intravenous Continuous Gladstone Lighter, MD      . enoxaparin (LOVENOX) injection 40 mg  40 mg Subcutaneous Q24H Gladstone Lighter, MD   40 mg at 12/22/18 1938  . famotidine (PEPCID) tablet 20 mg  20 mg Oral Daily Gladstone Lighter, MD   20 mg at 12/23/18 0914  . fluticasone (FLONASE) 50 MCG/ACT nasal spray 2 spray  2 spray Each Nare QHS Gladstone Lighter, MD      . lactated ringers infusion   Intravenous Continuous Sudini, Srikar, MD      . Melatonin  TABS 5 mg  1 tablet Oral QHS Gladstone Lighter, MD   5 mg at 12/22/18 1938  . memantine (NAMENDA) tablet 10 mg  10 mg Oral BID Gladstone Lighter, MD   10 mg at 12/23/18 0915  . mirtazapine (REMERON) tablet 7.5 mg  7.5 mg Oral QHS Gladstone Lighter, MD   7.5 mg at 12/22/18 1937  . OLANZapine (ZYPREXA) tablet 1.25 mg  1.25 mg Oral BID Gladstone Lighter, MD   1.25 mg at 12/23/18 0915  . ondansetron (ZOFRAN) tablet 4 mg  4 mg Oral Q6H PRN Gladstone Lighter, MD       Or  . ondansetron (ZOFRAN) injection 4 mg  4 mg Intravenous Q6H PRN Gladstone Lighter, MD      . predniSONE (DELTASONE) tablet 5 mg  5 mg Oral Q breakfast Tressia Miners, Radhika, MD      . sodium chloride 0.9 % bolus 250 mL  250 mL Intravenous Once Hillary Bow, MD         Discharge Medications: Please see discharge summary for a list of discharge medications.  Relevant Imaging Results:  Relevant Lab Results:   Additional Information SSN [id number]  Ross Ludwig, [place]

## 2018-12-23 NOTE — Care Management (Signed)
Notified patients son and spouse that PT would be coming in this afternoon to evaluate.

## 2018-12-23 NOTE — Care Management (Signed)
Notified Dr. Darvin Neighbours that PT reported patient became orthostatic 50's/30's.

## 2018-12-23 NOTE — Care Management (Signed)
Son Daneal asked for [facility] to come to room to speak about discharge.  He states he knows patient can be changed to inpatient and discharge to SNF.  RNCM explained to him as I did yesterday to husband that the patient was placed in outpatient observation.  The physician is ready to discharge patient as there is not a medical reason to keep patient at this time.  RNCM explained that Medicare will not pay if MD converts patient to inpatient without a medical reason; we can arrange SNF placement as a self pay.  Family is not interested in that.  Son is addressing this RNCM sternly and pointing at me while speaking.  This RNCM explained that I have spoken with CSW, unit director and Dr. Darvin Neighbours trying to figure out a way to place patient in SNF; unfortunately the only option would be self pay without a 3 night inpatient stay.  Asked PT to come re-evaluate patient per family request.

## 2018-12-23 NOTE — Progress Notes (Signed)
Physical Therapy Treatment Patient Details Name: Kelsey Morrison MRN: [id number] DOB: [birth year] Today's Date: 12/23/2018    History of Present Illness 85 y.o. female with a known history of Alzheimer's dementia, depression, hypertension, arthritis was brought in from by family secondary to significant weakness.    PT Comments    Patient alert in bed, agreeable to PT, no complaints of pain, oriented to self. Orthostatic vitals assessed, see flowsheet but pt significantly orthostatic with prolonged standing, 53/38. Patient able to mobilize to EOB with minAx1, with mild complaints of dizziness, sit<>Stand with minAx1. Eager to take a few steps to chair min-modAx1 and RW. Resolvment of dizziness in sitting after a few minutes. Pt unable to stand for BP without maxAx1 to obtain true orthostatic vitals, control eccentric stand to sit with maxAx1 as well. Overall the patient demonstrated significant limitations in function compared to baseline and would benefit from further skilled PT. Recommendation is STR due to current mobility limitations and assistance needed.     Follow Up Recommendations  SNF     Equipment Recommendations  Other (comment)(TBD at next venue of care)    Recommendations for Other Services       Precautions / Restrictions Precautions Precautions: Fall Precaution Comments: watch BP! Restrictions Weight Bearing Restrictions: No    Mobility  Bed Mobility Overal bed mobility: Needs Assistance Bed Mobility: Supine to Sit     Supine to sit: Min assist     General bed mobility comments: minAx1 for LE management and trunk elevation  Transfers Overall transfer level: Needs assistance Equipment used: Rolling walker (2 wheeled) Transfers: Sit to/from Stand Sit to Stand: Min assist         General transfer comment: Pt able to stand with minAx1, min-modAx1 to maintain standing position. verbal cues throughout for safet, sequencing of movement, AD  management  Ambulation/Gait Ambulation/Gait assistance: Mod assist;Min assist Gait Distance (Feet): 2 Feet Assistive device: Rolling walker (2 wheeled)       General Gait Details: Pt with mild complaints of dizziness during initial standing, eager to take a few steps to chair. Shuffling step noted, flexed trunk, posterior lean throughout   Stairs             Wheelchair Mobility    Modified Rankin (Stroke Patients Only)       Balance Overall balance assessment: Needs assistance Sitting-balance support: Feet supported;Bilateral upper extremity supported Sitting balance-Leahy Scale: Poor       Standing balance-Leahy Scale: Poor                              Cognition Arousal/Alertness: Awake/alert Behavior During Therapy: Flat affect;WFL for tasks assessed/performed Overall Cognitive Status: Within Functional Limits for tasks assessed                                        Exercises      General Comments        Pertinent Vitals/Pain Pain Assessment: No/denies pain    Home Living                      Prior Function            PT Goals (current goals can now be found in the care plan section) Progress towards PT goals: Not progressing toward goals - comment    Frequency  Min 2X/week      PT Plan Current plan remains appropriate    Co-evaluation              AM-PAC PT "6 Clicks" Mobility   Outcome Measure  Help needed turning from your back to your side while in a flat bed without using bedrails?: A Little Help needed moving from lying on your back to sitting on the side of a flat bed without using bedrails?: A Lot Help needed moving to and from a bed to a chair (including a wheelchair)?: A Lot Help needed standing up from a chair using your arms (e.g., wheelchair or bedside chair)?: A Little Help needed to walk in hospital room?: A Lot Help needed climbing 3-5 steps with a railing? : Total 6  Click Score: 13    End of Session Equipment Utilized During Treatment: Gait belt Activity Tolerance: Patient limited by fatigue Patient left: with chair alarm set;in chair;with family/visitor present;with call bell/phone within reach Nurse Communication: Mobility status PT Visit Diagnosis: Muscle weakness (generalized) (M62.81);Difficulty in walking, not elsewhere classified (R26.2);Dizziness and giddiness (R42)     Time: 3536-1443 PT Time Calculation (min) (ACUTE ONLY): 31 min  Charges:  $Therapeutic Activity: 23-37 mins                     Lieutenant Diego PT, DPT 4:40 PM,12/23/18 [phone]

## 2018-12-23 NOTE — Clinical Social Work Note (Signed)
Clinical Social Work Assessment  Patient Details  Name: Kelsey Morrison MRN: [id number] Date of Birth: 1934-07-17  Date of referral:  12/23/18               Reason for consult:  Facility Placement                Permission sought to share information with:  Facility Sport and exercise psychologist, Family Supports Permission granted to share information::  Yes, Verbal Permission Granted  Name::     Mancil,Bennie Spouse [phone] or Cherie Dark [phone]   Agency::  SNF admissions  Relationship::     Contact Information:     Housing/Transportation Living arrangements for the past 2 months:  Single Family Home Source of Information:  Spouse Patient Interpreter Needed:  None Criminal Activity/Legal Involvement Pertinent to Current Situation/Hospitalization:  No - Comment as needed Significant Relationships:  Adult Children, Spouse Lives with:  Spouse Do you feel safe going back to the place where you live?  No Need for family participation in patient care:  Yes (Comment)  Care giving concerns:  Patient's family feels she needs some short term rehab before she is able to return back home.   Social Worker assessment / plan:  Patient is an 85 year old female who is alert and oriented x1.  Patient has some dementia, assessment completed by speaking with the husband.  Husband reports that patient has been to rehab in the past.  Patient's family feels that she would benefit from some short term rehab before she is ready to return back home.  Patient was initially under observation, CSW explained how insurance would not pay for patient, and how family would have to private pay, however patient was then switched to inpatient this afternoon.  Patient's family was explained how insurance will pay for stay and what to expect at SNF.  CSW was given permission to begin bed search in [place].   Employment status:  Retired Forensic scientist:  Medicare PT Recommendations:   [place] / Referral to community resources:  [place]  Patient/Family's Response to care:  Patient's family is agreeable to going to SNF for short term rehab.  Patient/Family's Understanding of and Emotional Response to Diagnosis, Current Treatment, and Prognosis:  Patient has dementia not aware of diagnosis, but family are hopeful that she can go to SNF for short term rehab and then return back home.  Emotional Assessment Appearance:  Appears stated age Attitude/Demeanor/Rapport:    Affect (typically observed):  Appropriate, Stable, Quiet Orientation:  Oriented to Self Alcohol / Substance use:  Not Applicable Psych involvement (Current and /or in the community):  No (Comment)  Discharge Needs  Concerns to be addressed:  Lack of Support, Cognitive Concerns, Decision making concerns, Care Coordination Readmission within the last 30 days:  No Current discharge risk:  Cognitively Impaired, Lack of support system, Physical Impairment Barriers to Discharge:  Continued Medical Work up, [employer]   Anell Barr 12/23/2018, 4:34 PM

## 2018-12-24 ENCOUNTER — Other Ambulatory Visit: Payer: Self-pay

## 2018-12-24 MED ORDER — MIRTAZAPINE 15 MG PO TABS
15.0000 mg | ORAL_TABLET | Freq: Every day | ORAL | Status: DC
  Administered 2018-12-24 – 2018-12-25 (×2): 15 mg via ORAL
  Filled 2018-12-24 (×2): qty 1

## 2018-12-24 MED ORDER — MIRTAZAPINE 15 MG PO TABS
7.5000 mg | ORAL_TABLET | Freq: Once | ORAL | Status: AC
  Administered 2018-12-24: 7.5 mg via ORAL
  Filled 2018-12-24: qty 1

## 2018-12-24 MED ORDER — HALOPERIDOL LACTATE 5 MG/ML IJ SOLN
2.5000 mg | Freq: Three times a day (TID) | INTRAMUSCULAR | Status: DC | PRN
  Administered 2018-12-24: 2.5 mg via INTRAMUSCULAR
  Filled 2018-12-24: qty 1

## 2018-12-24 NOTE — Progress Notes (Signed)
Called Dr. Jerelyn Charles about the patient being restless and pulling out her IV and being agitated. Gave new orders for Remeron and Haldol.

## 2018-12-24 NOTE — Progress Notes (Signed)
Spouse at bedside at this time. Notified spouse of patient fall.

## 2018-12-24 NOTE — Plan of Care (Signed)
  Problem: Clinical Measurements: Goal: Will remain free from infection Outcome: Progressing Goal: Diagnostic test results will improve Outcome: Progressing Goal: Respiratory complications will improve Outcome: Progressing   Problem: Coping: Goal: Level of anxiety will decrease Outcome: Not Progressing  Patient is not sleeping. Pulled her IV out.

## 2018-12-24 NOTE — Progress Notes (Signed)
Patient heart rate in 90's.  Patient in sinus rhythm at this time. Dr. Darvin Neighbours aware. Husband at bedside.

## 2018-12-24 NOTE — Progress Notes (Signed)
Attempted to check patient orthostatic BP.  Patient unable to tolerate standing to complete orthos.  Attempted to stand with 2 assist.  Patient helped back into bed and will try to complete orthostatic vitals at later time today.

## 2018-12-24 NOTE — Progress Notes (Signed)
[place] [facility] [place] NAME: Kelsey Morrison    MR#:  [id number]  DATE OF BIRTH:  05-08-1934  SUBJECTIVE:  CHIEF COMPLAINT:   Chief Complaint  Patient presents with  . Weakness  . Fall   Confused and weak. Husband at bedside.  REVIEW OF SYSTEMS:    Review of Systems  Unable to perform ROS: Dementia   DRUG ALLERGIES:   Allergies  Allergen Reactions  . Celecoxib Other (See Comments)  . Hydrocodone Other (See Comments)    Altered mental status    VITALS:  Blood pressure 110/62, pulse 96, temperature 97.7 F (36.5 C), temperature source Oral, resp. rate 18, height 5\' 6"  (1.676 m), weight 65.8 kg, SpO2 100 %.  PHYSICAL EXAMINATION:   Physical Exam  GENERAL:  85 y.o.-year-old patient lying in the bed with no acute distress.  EYES: Pupils equal, round, reactive to light and accommodation. No scleral icterus. Extraocular muscles intact.  HEENT: Head atraumatic, normocephalic. Oropharynx and nasopharynx clear.  NECK:  Supple, no jugular venous distention. No thyroid enlargement, no tenderness.  LUNGS: Normal breath sounds bilaterally, no wheezing, rales, rhonchi. No use of accessory muscles of respiration.  CARDIOVASCULAR: S1, S2 normal. No murmurs, rubs, or gallops.  ABDOMEN: Soft, nontender, nondistended. Bowel sounds present. No organomegaly or mass.  EXTREMITIES: No cyanosis, clubbing or edema b/l.    NEUROLOGIC: Cranial nerves II through XII are intact. No focal Motor or sensory deficits b/l.   PSYCHIATRIC: The patient is alert and awake.  Confused SKIN: No obvious rash, lesion, or ulcer.   LABORATORY PANEL:   CBC Recent Labs  Lab 12/22/18 0442  WBC 5.6  HGB 10.7*  HCT 35.0*  PLT 286   ------------------------------------------------------------------------------------------------------------------ Chemistries  Recent Labs  Lab 12/21/18 1034  12/23/18 1610  NA 135   < > 134*  K 3.8   < > 4.4  CL 102   < > 104   CO2 23   < > 20*  GLUCOSE 109*   < > 97  BUN 28*   < > 29*  CREATININE 0.94   < > 0.91  CALCIUM 9.2   < > 8.9  AST 19  --   --   ALT 11  --   --   ALKPHOS 151*  --   --   BILITOT 0.4  --   --    < > = values in this interval not displayed.   ------------------------------------------------------------------------------------------------------------------  Cardiac Enzymes Recent Labs  Lab 12/22/18 0442  TROPONINI 0.03*   ------------------------------------------------------------------------------------------------------------------  RADIOLOGY:  No results found.   ASSESSMENT AND PLAN:   Kelsey Morrison  is a 85 y.o. female with a known history of Alzheimer's dementia, depression, hypertension, arthritis was brought in from by family secondary to significant weakness and inability to get out of bed today.  * orthostatic hypotension TSH and cortisol normal IV fluids given.  Will stop today Likely chronic  *Paroxysmal atrial fibrillation.  Converted to normal sinus rhythm with 1 dose of IV Cardizem.  We will start her on oral Cardizem daily.  Echocardiogram - nothing acute.  TSH normal.  No chest pain.  Troponins stayed normal.  Will need outpatient follow-up. No anti coagulation due to risk of falls  *  Falls and weakness Likely due to progressive decline with dementia.  But with physical therapy,extremely weak.  Continue physical therapy Skilled nursing facility recommended .  *  Dementia-seems to be pleasantly confused.   Monitor for  inpatient delirium  *  Depression anxiety-continue home medications  *  DVT prophylaxis-Lovenox  All the records are reviewed and case discussed with Care Management/Social Worker Management plans discussed with the patient, family and they are in agreement.  CODE STATUS: DO NOT RESUSCITATE and DO NOT INTUBATE  TOTAL TIME TAKING CARE OF THIS PATIENT: 30 minutes.   POSSIBLE D/C IN 1-2 DAYS, DEPENDING ON CLINICAL  CONDITION.  Leia Alf Tram Wrenn M.D on 12/24/2018 at 2:13 PM  Between 7am to 6pm - Pager - [phone]  After 6pm go to [email or web address] - password [email or web address] [facility]  Office  [phone]  CC: Primary care physician; [place] Pink, MD  Note: This dictation was prepared with Dragon dictation along with smaller phrase technology. Any transcriptional errors that result from this process are unintentional.

## 2018-12-24 NOTE — Progress Notes (Signed)
Patient noted on monitor to have heart rate 120s-140s.  EKG obtained and found to be in afib.  Patient asymptomatic at this time.  She is confused at baseline and is anxious in the room about wanting to go home.  Dr. Darvin Neighbours made aware.  Patient given her PO cardizem at this time.

## 2018-12-24 NOTE — Progress Notes (Signed)
Patient found to be on floor when staff entered room due to bed alarm going off.  Patient found sitting on floor on floor mats.  Patient unable to stand on own.  Assisted back into bed by two RNs.  Vital signs taken.  Patient sinus rhythm on telemetry.  Patient alert and disoriented x4.  No c/o pain.  Patient assessed and no new skin issues or wounds noted post fall.  Dr. Darvin Neighbours called and made aware, no new orders received.  Patient spouse called and no answer or option to leave voicemail. Prior to fall, patient with low bed in place, floor mats, bed alarm on highest sensitivity and frequent rounding.  Will continue and add safety sitter in room.

## 2018-12-25 MED ORDER — METOPROLOL TARTRATE 25 MG PO TABS
25.0000 mg | ORAL_TABLET | Freq: Two times a day (BID) | ORAL | Status: DC
  Administered 2018-12-25 – 2018-12-26 (×3): 25 mg via ORAL
  Filled 2018-12-25 (×3): qty 1

## 2018-12-25 MED ORDER — METOPROLOL TARTRATE 25 MG PO TABS: 25.0000 mg | ORAL_TABLET | Freq: Two times a day (BID) | ORAL | Status: DC

## 2018-12-25 NOTE — Clinical Social Work Note (Signed)
CSW contacted patient's husband and presented bed offers.  Patient's husband chose [facility].  CSW contacted [facility] and they can accept patient over the weekend if she is medically ready for discharge and orders have been received.  Patient will go to a private room 320.  CSW to continue to follow patient's progress throughout discharge planning.  Jones Broom. [place], MSW, [place]  12/25/2018 2:49 PM

## 2018-12-25 NOTE — Progress Notes (Signed)
Physical Therapy Treatment Patient Details Name: Kelsey Morrison MRN: [id number] DOB: 02-Feb-1934 Today's Date: 12/25/2018    History of Present Illness 85 y.o. female with a known history of Alzheimer's dementia, depression, hypertension, arthritis was brought in from by family secondary to significant weakness.    PT Comments    Patient with nursing staff at bedside, sleeping soundly, soft mittens in place. Pt woken with tactile cues, repositioning, turning lights on, and cued throughout session to keep her eyes open, pleasant and denied any pain. Pt was able to participate in bed level exercises with AAROM, verbal and visual cues. Pt was maxAx1 to sit EOB, unable to sit without min-modAx1 to maintain balance. Able to attempt to correct posture, but could not achieve without support. Able to perform LAQ with modAx1 for posture, pt fatigued. Returned to bed maxAx1, repositioned maxAx2. Overall the patient demonstrated acute decline in functional status and would continue to benefit from further skilled PT.   Follow Up Recommendations  SNF     Equipment Recommendations  Other (comment)(TBD at next venue of care)    Recommendations for Other Services       Precautions / Restrictions Precautions Precautions: Fall Precaution Comments: watch BP, mittens in place Restrictions Weight Bearing Restrictions: No    Mobility  Bed Mobility Overal bed mobility: Needs Assistance Bed Mobility: Supine to Sit;Sit to Supine     Supine to sit: Max assist Sit to supine: Max assist      Transfers                 General transfer comment: deferred due to safety concerns. Pt unable to sit EOB without min-modAx1 to maintain balance, lethargic, though pleasant  Ambulation/Gait                 Stairs             Wheelchair Mobility    Modified Rankin (Stroke Patients Only)       Balance Overall balance assessment: Needs assistance Sitting-balance support: Feet  supported;Bilateral upper extremity supported Sitting balance-Leahy Scale: Zero                                      Cognition Arousal/Alertness: Lethargic Behavior During Therapy: WFL for tasks assessed/performed;Flat affect Overall Cognitive Status: History of cognitive impairments - at baseline                                 General Comments: per family, worsening dementia      Exercises General Exercises - Lower Extremity Ankle Circles/Pumps: AAROM;Both;10 reps Long Arc Quad: AROM;Seated;Both;10 reps Heel Slides: AAROM;Both;10 reps Hip ABduction/ADduction: AAROM;Both;10 reps    General Comments        Pertinent Vitals/Pain Pain Assessment: No/denies pain    Home Living                      Prior Function            PT Goals (current goals can now be found in the care plan section) Progress towards PT goals: Not progressing toward goals - comment    Frequency    Min 2X/week      PT Plan Current plan remains appropriate    Co-evaluation              AM-PAC PT "  6 Clicks" Mobility   Outcome Measure  Help needed turning from your back to your side while in a flat bed without using bedrails?: A Lot Help needed moving from lying on your back to sitting on the side of a flat bed without using bedrails?: Total Help needed moving to and from a bed to a chair (including a wheelchair)?: Total Help needed standing up from a chair using your arms (e.g., wheelchair or bedside chair)?: Total Help needed to walk in hospital room?: Total Help needed climbing 3-5 steps with a railing? : Total 6 Click Score: 7    End of Session   Activity Tolerance: Patient limited by fatigue;Patient limited by lethargy Patient left: in bed;with nursing/sitter in room;with call bell/phone within reach;with bed alarm set(fall mats in place, bed in low position) Nurse Communication: Mobility status PT Visit Diagnosis: Muscle weakness  (generalized) (M62.81);Difficulty in walking, not elsewhere classified (R26.2);Dizziness and giddiness (R42)     Time: 4401-0272 PT Time Calculation (min) (ACUTE ONLY): 14 min  Charges:  $Therapeutic Exercise: 8-22 mins                    Lieutenant Diego PT, DPT 12:06 PM,12/25/18 [phone]

## 2018-12-25 NOTE — Discharge Summary (Signed)
[name] [place] NAME: Kelsey Morrison    MR#:  [id number]  DATE OF BIRTH:  [birth year]  DATE OF ADMISSION:  12/21/2018 ADMITTING PHYSICIAN: Gladstone Lighter, MD  DATE OF DISCHARGE: 12/26/2018  PRIMARY CARE PHYSICIAN: Maryland Pink, MD   ADMISSION DIAGNOSIS:  Orthostatic hypotension [I95.1] Weakness [R53.1] Intermittent atrial fibrillation ([place]) [I48.0]  DISCHARGE DIAGNOSIS:  Active Problems:   A-fib (HCC)   Orthostatic hypotension   SECONDARY DIAGNOSIS:   Past Medical History:  Diagnosis Date  . Alzheimer's dementia ([place])   . Anxiety   . Cancer [facility])    breast cancer  . Depression   . Dry mouth   . HLD (hyperlipidemia)   . Hypertension   . Inflammatory arthritis    seronegative  . Osteoarthritis      ADMITTING HISTORY  HISTORY OF PRESENT ILLNESS:  Kelsey Morrison  is a 85 y.o. female with a known history of Alzheimer's dementia, depression, hypertension, arthritis was brought in from by family secondary to significant weakness and inability to get out of bed today. Patient is unable to contribute to the history due to her dementia.  She is only oriented to self.  Most of the history is obtained from her husband at bedside.  According to the husband, patient has been falling in the last 2 days.  No syncopal episodes.  She is very unsteady on her feet.  Today she could not even get out of bed and he could not carry her so called ambulance to get to the emergency room.  Patient denies any pain at this time.  Noted to be orthostatically hypotensive here in the emergency room and was in A. fib and she, converted to sinus rhythm after 1 dose of Cardizem.  Urine analysis negative for infection.  She is being admitted under observation for her weakness.   HOSPITAL COURSE:   PeggySimmonsis a84 y.o.femalewith a known history of Alzheimer's dementia, depression, hypertension, arthritis was brought in from by family secondary to  significant weakness and inability to get out of bed today.  * orthostatic hypotension TSH and cortisol normal IV fluids given.   Likely chronic.  Some improvement with IV fluids.  Will need slow change in position from laying to sitting and sitting to standing.  *Paroxysmal atrial fibrillation.  Converted to normal sinus rhythm with 1 dose of IV Cardizem.  Started on cardizem drip and metoprolol.  Echocardiogram - nothing acute.  TSH normal.  No chest pain.  Troponins stayed normal.   No anti coagulation due to risk of falls. On baby aspirin  * Falls and weakness Likely due to progressive decline with dementia.  But with physical therapy,extremely weak.  Continue physical therapy Skilled nursing facility recommended .  * Dementia- pleasantly confused.  Monitor for inpatient delirium  * Depression anxiety-continue home medications  Patient will be discharged to skilled nursing facility on 12/26/2018  CONSULTS OBTAINED:  Physical therapy  DRUG ALLERGIES:   Allergies  Allergen Reactions  . Celecoxib Other (See Comments)  . Hydrocodone Other (See Comments)    Altered mental status    DISCHARGE MEDICATIONS:   Allergies as of 12/25/2018      Reactions   Celecoxib Other (See Comments)   Hydrocodone Other (See Comments)   Altered mental status      Medication List    STOP taking these medications   amLODipine 5 MG tablet Commonly known as:  NORVASC   predniSONE 5 MG tablet Commonly known as:  DELTASONE     TAKE these medications   antiseptic oral rinse Liqd 15 mLs by Mouth Rinse route at bedtime.   aspirin EC 81 MG tablet Take 81 mg by mouth at bedtime.   calcium citrate 950 MG tablet Commonly known as:  CALCITRATE - dosed in mg elemental calcium Take 200 mg of elemental calcium by mouth daily.   cimetidine 300 MG tablet Commonly known as:  TAGAMET Take 300 mg by mouth 2 (two) times daily.   clonazePAM 1 MG tablet Commonly known as:  KLONOPIN Take  1 mg by mouth at bedtime.   clotrimazole 10 MG troche Commonly known as:  MYCELEX Take 10 mg by mouth 3 (three) times daily. Pt takes 1 tab with every meal   diltiazem 120 MG 24 hr capsule Commonly known as:  CARDIZEM CD Take 1 capsule (120 mg total) by mouth daily.   Melatonin 5 MG Tabs Take 1 tablet by mouth at bedtime.   memantine 10 MG tablet Commonly known as:  NAMENDA Take 10 mg by mouth 2 (two) times daily.   metoprolol tartrate 25 MG tablet Commonly known as:  LOPRESSOR Take 1 tablet (25 mg total) by mouth 2 (two) times daily.   mirtazapine 7.5 MG tablet Commonly known as:  REMERON Take 7.5 mg by mouth at bedtime.   OLANZapine 2.5 MG tablet Commonly known as:  ZYPREXA Take 1.25 mg by mouth 2 (two) times daily.   T.E.D. BELOW KNEE/S-REGULAR Misc 2 Units by Does not apply route daily.   triamcinolone 55 MCG/ACT Aero nasal inhaler Commonly known as:  NASACORT Place 1 spray into the nose at bedtime.       Today   VITAL SIGNS:  Blood pressure 139/65, pulse 90, temperature 97.7 F (36.5 C), temperature source Oral, resp. rate 18, height 5\' 6"  (1.676 m), weight 65.8 kg, SpO2 100 %.  I/O:    Intake/Output Summary (Last 24 hours) at 12/25/2018 1303 Last data filed at 12/25/2018 1106 Gross per 24 hour  Intake -  Output 800 ml  Net -800 ml    PHYSICAL EXAMINATION:  Physical Exam  GENERAL:  85 y.o.-year-old patient lying in the bed with no acute distress.  LUNGS: Normal breath sounds bilaterally, no wheezing, rales,rhonchi or crepitation. No use of accessory muscles of respiration.  CARDIOVASCULAR: S1, S2 normal. No murmurs, rubs, or gallops.  ABDOMEN: Soft, non-tender, non-distended. Bowel sounds present. No organomegaly or mass.  NEUROLOGIC: Moves all 4 extremities. PSYCHIATRIC: The patient is confused SKIN: No obvious rash, lesion, or ulcer.   DATA REVIEW:   CBC Recent Labs  Lab 12/22/18 0442  WBC 5.6  HGB 10.7*  HCT 35.0*  PLT 286     Chemistries  Recent Labs  Lab 12/21/18 1034  12/23/18 1610  NA 135   < > 134*  K 3.8   < > 4.4  CL 102   < > 104  CO2 23   < > 20*  GLUCOSE 109*   < > 97  BUN 28*   < > 29*  CREATININE 0.94   < > 0.91  CALCIUM 9.2   < > 8.9  AST 19  --   --   ALT 11  --   --   ALKPHOS 151*  --   --   BILITOT 0.4  --   --    < > = values in this interval not displayed.    Cardiac Enzymes Recent Labs  Lab 12/22/18 0442  TROPONINI 0.03*  Microbiology Results  Results for orders placed or performed during the hospital encounter of 12/21/18  Urine Culture     Status: None   Collection Time: 12/21/18 10:33 AM  Result Value Ref Range Status   Specimen Description   Final    URINE, RANDOM Performed at [facility], [address] [place], [place] [address]    Special Requests   Final    NONE Performed at [facility], [address], [place], [place] [address]    Culture   Final    NO GROWTH Performed at [facility], [place] [address] [place], [place] [address]    Report Status 12/23/2018 FINAL  Final    RADIOLOGY:  No results found.  Follow up with PCP in 1 week.  Management plans discussed with the patient, family and they are in agreement.  CODE STATUS:     Code Status Orders  (From admission, onward)         Start     Ordered   12/22/18 1903  Do not attempt resuscitation (DNR)  Continuous    Question Answer Comment  In the event of cardiac or respiratory ARREST Do not call a "code blue"   In the event of cardiac or respiratory ARREST Do not perform Intubation, CPR, defibrillation or ACLS   In the event of cardiac or respiratory ARREST Use medication by any route, position, wound care, and other measures to relive pain and suffering. May use oxygen, suction and manual treatment of airway obstruction as needed for comfort.      12/22/18 1902        Code Status History    Date Active Date Inactive Code Status Order ID Comments User  Context   12/21/2018 1656 12/22/2018 1902 Full Code [id number]  Gladstone Lighter, MD Inpatient   05/18/2017 1412 05/20/2017 1654 Full Code [id number]  Baxter Hire, MD Inpatient   07/25/2015 1735 07/27/2015 1402 Full Code [id number]  Lance Coon, MD Inpatient      TOTAL TIME TAKING CARE OF THIS PATIENT ON DAY OF DISCHARGE: more than 30 minutes.   Leia Alf Randell Detter M.D on 12/25/2018 at 1:03 PM  Between 7am to 6pm - Pager - [phone]  After 6pm go to [email or web address] - password [email or web address] [facility]  Office  [phone]  CC: Primary care physician; Maryland Pink, MD  Note: This dictation was prepared with Dragon dictation along with smaller phrase technology. Any transcriptional errors that result from this process are unintentional.

## 2018-12-25 NOTE — Care Management Important Message (Signed)
Copy of signed Medicare IM left with patient in room. 

## 2018-12-25 NOTE — Progress Notes (Signed)
Patient has been asleep all morning. Responds to voice. Has not been fully awake to take morning medications. No signs of distress at this moment. Will continue to monitor patient.

## 2018-12-25 NOTE — Plan of Care (Signed)
  Problem: Clinical Measurements: Goal: Respiratory complications will improve Outcome: Progressing Goal: Cardiovascular complication will be avoided Outcome: Progressing   Problem: Coping: Goal: Level of anxiety will decrease Outcome: Not Progressing  Patient continues to be restless.

## 2018-12-25 NOTE — Progress Notes (Signed)
[facility]el[facility] Morrison at [place] NAME: Kelsey Morrison    MR#:  [id number]  DATE OF BIRTH:  06/25/1934  SUBJECTIVE:  CHIEF COMPLAINT:   Chief Complaint  Patient presents with  . Weakness  . Fall   Confused. In NSR  REVIEW OF SYSTEMS:    Review of Systems  Unable to perform ROS: Dementia   DRUG ALLERGIES:   Allergies  Allergen Reactions  . Celecoxib Other (See Comments)  . Hydrocodone Other (See Comments)    Altered mental status    VITALS:  Blood pressure 139/65, pulse 90, temperature 97.7 F (36.5 C), temperature source Oral, resp. rate 18, height 5\' 6"  (1.676 m), weight 65.8 kg, SpO2 100 %.  PHYSICAL EXAMINATION:   Physical Exam  GENERAL:  85 y.o.-year-old patient lying in the bed with no acute distress.  EYES: Pupils equal, round, reactive to light and accommodation. No scleral icterus. Extraocular muscles intact.  HEENT: Head atraumatic, normocephalic. Oropharynx and nasopharynx clear.  NECK:  Supple, no jugular venous distention. No thyroid enlargement, no tenderness.  LUNGS: Normal breath sounds bilaterally, no wheezing, rales, rhonchi. No use of accessory muscles of respiration.  CARDIOVASCULAR: S1, S2 normal. No murmurs, rubs, or gallops.  ABDOMEN: Soft, nontender, nondistended. Bowel sounds present. No organomegaly or mass.  EXTREMITIES: No cyanosis, clubbing or edema b/l.    NEUROLOGIC: Cranial nerves II through XII are intact. No focal Motor or sensory deficits b/l.   PSYCHIATRIC: The patient is alert and awake.  Confused SKIN: No obvious rash, lesion, or ulcer.   LABORATORY PANEL:   CBC Recent Labs  Lab 12/22/18 0442  WBC 5.6  HGB 10.7*  HCT 35.0*  PLT 286   ------------------------------------------------------------------------------------------------------------------ Chemistries  Recent Labs  Lab 12/21/18 1034  12/23/18 1610  NA 135   < > 134*  K 3.8   < > 4.4  CL 102   < > 104  CO2 23   < > 20*   GLUCOSE 109*   < > 97  BUN 28*   < > 29*  CREATININE 0.94   < > 0.91  CALCIUM 9.2   < > 8.9  AST 19  --   --   ALT 11  --   --   ALKPHOS 151*  --   --   BILITOT 0.4  --   --    < > = values in this interval not displayed.   ------------------------------------------------------------------------------------------------------------------  Cardiac Enzymes Recent Labs  Lab 12/22/18 0442  TROPONINI 0.03*   ------------------------------------------------------------------------------------------------------------------  RADIOLOGY:  No results found.   ASSESSMENT AND PLAN:   Kelsey Morrison  is a 85 y.o. female with a known history of Alzheimer's dementia, depression, hypertension, arthritis was brought in from by family secondary to significant weakness and inability to get out of bed today.  * orthostatic hypotension TSH and cortisol normal IV fluids given.   Likely chronic  *Paroxysmal atrial fibrillation.  Converted to normal sinus rhythm with 1 dose of IV Cardizem.  Started on cardizem drip and metoprolol.  Echocardiogram - nothing acute.  TSH normal.  No chest pain.  Troponins stayed normal.   No anti coagulation due to risk of falls. On baby aspirin  *  Falls and weakness Likely due to progressive decline with dementia.  But with physical therapy,extremely weak.  Continue physical therapy Skilled nursing facility recommended .  *  Dementia- pleasantly confused.   Monitor for inpatient delirium  *  Depression anxiety-continue home medications  *  DVT prophylaxis-Lovenox  All the records are reviewed and case discussed with Care Management/Social Worker Management plans discussed with the patient, family and they are in agreement.  CODE STATUS: DO NOT RESUSCITATE and DO NOT INTUBATE  TOTAL TIME TAKING CARE OF THIS PATIENT: 30 minutes.   POSSIBLE D/C IN 1-2 DAYS, DEPENDING ON CLINICAL CONDITION.  Kelsey Morrison M.D on 12/25/2018 at 1:01 PM  Between 7am to  6pm - Pager - [phone]  After 6pm go to [email or web address] - password [email or web address] [facility]  Office  [phone]  CC: Primary care physician; Maryland Pink, MD  Note: This dictation was prepared with Dragon dictation along with smaller phrase technology. Any transcriptional errors that result from this process are unintentional.

## 2018-12-26 NOTE — Clinical Social Work Note (Addendum)
The patient will discharge today to [facility] room 320. The CSW has attempted to contact the patient's son and was unable to leave a voice mail due to a full mailbox. The facility is aware and in agreement. The CSW has sent documentation to the facility and will deliver the discharge packet as soon as possible. Once the packet is delivered, the CSW will sign off. Please consult should needs arise.  UPDATE: The patient's son called back and is aware of the discharge. The patient's son also updated the CSW that the patient's husband had to have emergency surgery at [facility] and is aware that the patient will discharge today.   Santiago Bumpers, MSW, Latanya Presser [phone]

## 2018-12-26 NOTE — Progress Notes (Signed)
Called and gave report to the receiving facility nurse at this time. All questions answered. Kelsey Morrison M Kelsey Morrison  Called [facility] and requested non-emergent patient transport to the receiving facility at this time. Awaiting arrival. Kelsey Morrison M Kelsey Morrison 

## 2018-12-26 NOTE — Discharge Summary (Signed)
[facility] [place] NAME: Kelsey Morrison    MR#:  [id number]  DATE OF BIRTH:  1934-04-21  DATE OF ADMISSION:  12/21/2018 ADMITTING PHYSICIAN: Gladstone Lighter, MD  DATE OF DISCHARGE: 12/26/2018  PRIMARY CARE PHYSICIAN: Maryland Pink, MD   ADMISSION DIAGNOSIS:  Orthostatic hypotension [I95.1] Weakness [R53.1] Intermittent atrial fibrillation ([place]) [I48.0]  DISCHARGE DIAGNOSIS:  Active Problems:   A-fib (HCC)   Orthostatic hypotension   SECONDARY DIAGNOSIS:   Past Medical History:  Diagnosis Date  . Alzheimer's dementia ([place])   . Anxiety   . Cancer [facility])    breast cancer  . Depression   . Dry mouth   . HLD (hyperlipidemia)   . Hypertension   . Inflammatory arthritis    seronegative  . Osteoarthritis      ADMITTING HISTORY  HISTORY OF PRESENT ILLNESS:  Kelsey Morrison  is a 85 y.o. female with a known history of Alzheimer's dementia, depression, hypertension, arthritis was brought in from by family secondary to significant weakness and inability to get out of bed today. Patient is unable to contribute to the history due to her dementia.  She is only oriented to self.  Most of the history is obtained from her husband at bedside.  According to the husband, patient has been falling in the last 2 days.  No syncopal episodes.  She is very unsteady on her feet.  Today she could not even get out of bed and he could not carry her so called ambulance to get to the emergency room.  Patient denies any pain at this time.  Noted to be orthostatically hypotensive here in the emergency room and was in A. fib and she, converted to sinus rhythm after 1 dose of Cardizem.  Urine analysis negative for infection.  She is being admitted under observation for her weakness.   HOSPITAL COURSE:   PeggySimmonsis a84 y.o.femalewith a known history of Alzheimer's dementia, depression, hypertension, arthritis was brought in from by family secondary to  significant weakness and inability to get out of bed today.  * orthostatic hypotension TSH and cortisol normal IV fluids given.   Likely chronic.  Some improvement with IV fluids.  Will need slow change in position from laying to sitting and sitting to standing.  *Paroxysmal atrial fibrillation.  Converted to normal sinus rhythm with 1 dose of IV Cardizem.  Started on cardizem drip and metoprolol.  Echocardiogram - nothing acute.  TSH normal.  No chest pain.  Troponins stayed normal.   No anti coagulation due to risk of falls. On baby aspirin  * Falls and weakness Likely due to progressive decline with dementia.  But with physical therapy,extremely weak.  Continue physical therapy Skilled nursing facility recommended .  * Dementia- pleasantly confused.  Monitor for inpatient delirium  * Depression anxiety-continue home medications  Patient will be discharged to skilled nursing facility on 12/26/2018  CONSULTS OBTAINED:  Physical therapy  DRUG ALLERGIES:   Allergies  Allergen Reactions  . Celecoxib Other (See Comments)  . Hydrocodone Other (See Comments)    Altered mental status    DISCHARGE MEDICATIONS:   Allergies as of 12/26/2018      Reactions   Celecoxib Other (See Comments)   Hydrocodone Other (See Comments)   Altered mental status      Medication List    STOP taking these medications   naproxen 250 MG tablet Commonly known as:  NAPROSYN   predniSONE 5 MG tablet Commonly known as:  DELTASONE     TAKE these medications   alendronate 70 MG tablet Commonly known as:  FOSAMAX Take 70 mg by mouth once a week. Take with a full glass of water on an empty stomach.   amLODipine 5 MG tablet Commonly known as:  NORVASC Take 5 mg by mouth daily. What changed:  Another medication with the same name was removed. Continue taking this medication, and follow the directions you see here.   antiseptic oral rinse Liqd 15 mLs by Mouth Rinse route at bedtime as  needed for dry mouth.   aspirin EC 81 MG tablet Take 81 mg by mouth at bedtime.   calcium citrate 950 MG tablet Commonly known as:  CALCITRATE - dosed in mg elemental calcium Take 200 mg of elemental calcium by mouth daily.   clotrimazole 10 MG troche Commonly known as:  MYCELEX Take 10 mg by mouth 2 (two) times daily.   diltiazem 120 MG 24 hr capsule Commonly known as:  CARDIZEM CD Take 1 capsule (120 mg total) by mouth daily.   memantine 10 MG tablet Commonly known as:  NAMENDA Take 10 mg by mouth 2 (two) times daily.   metoprolol tartrate 25 MG tablet Commonly known as:  LOPRESSOR Take 1 tablet (25 mg total) by mouth 2 (two) times daily.   mirtazapine 7.5 MG tablet Commonly known as:  REMERON Take 7.5 mg by mouth at bedtime.   sertraline 100 MG tablet Commonly known as:  ZOLOFT Take 100 mg by mouth daily.   T.E.D. BELOW KNEE/S-REGULAR Misc 2 Units by Does not apply route daily.   venlafaxine XR 75 MG 24 hr capsule Commonly known as:  EFFEXOR-XR Take 75 mg by mouth daily with breakfast.   vitamin B-12 1000 MCG tablet Commonly known as:  CYANOCOBALAMIN Take 1,000 mcg by mouth daily.       Today   VITAL SIGNS:  Blood pressure (!) 152/58, pulse 67, temperature 97.7 F (36.5 C), temperature source Oral, resp. rate 18, height 5\' 6"  (1.676 m), weight 65.8 kg, SpO2 99 %.  I/O:    Intake/Output Summary (Last 24 hours) at 12/26/2018 0839 Last data filed at 12/26/2018 0604 Gross per 24 hour  Intake -  Output 950 ml  Net -950 ml    PHYSICAL EXAMINATION:  Physical Exam  GENERAL:  85 y.o.-year-old patient lying in the bed with no acute distress.  LUNGS: Normal breath sounds bilaterally, no wheezing, rales,rhonchi or crepitation. No use of accessory muscles of respiration.  CARDIOVASCULAR: S1, S2 normal. No murmurs, rubs, or gallops.  ABDOMEN: Soft, non-tender, non-distended. Bowel sounds present. No organomegaly or mass.  NEUROLOGIC: Moves all 4  extremities. PSYCHIATRIC: The patient is confused SKIN: No obvious rash, lesion, or ulcer.   DATA REVIEW:   CBC Recent Labs  Lab 12/22/18 0442  WBC 5.6  HGB 10.7*  HCT 35.0*  PLT 286    Chemistries  Recent Labs  Lab 12/21/18 1034  12/23/18 1610  NA 135   < > 134*  K 3.8   < > 4.4  CL 102   < > 104  CO2 23   < > 20*  GLUCOSE 109*   < > 97  BUN 28*   < > 29*  CREATININE 0.94   < > 0.91  CALCIUM 9.2   < > 8.9  AST 19  --   --   ALT 11  --   --   ALKPHOS 151*  --   --  BILITOT 0.4  --   --    < > = values in this interval not displayed.    Cardiac Enzymes Recent Labs  Lab 12/22/18 0442  TROPONINI 0.03*    Microbiology Results  Results for orders placed or performed during the hospital encounter of 12/21/18  Urine Culture     Status: None   Collection Time: 12/21/18 10:33 AM  Result Value Ref Range Status   Specimen Description   Final    URINE, RANDOM Performed at [facility], [address], [place], [place] [address]    Special Requests   Final    NONE Performed at [facility], [address], [place], [place] [address]    Culture   Final    NO GROWTH Performed at [facility], [place] [address], [place], [place] [address]    Report Status 12/23/2018 FINAL  Final    RADIOLOGY:  No results found.  Follow up with PCP in 1 week.  Management plans discussed with the patient, family and they are in agreement.  CODE STATUS:     Code Status Orders  (From admission, onward)         Start     Ordered   12/22/18 1903  Do not attempt resuscitation (DNR)  Continuous    Question Answer Comment  In the event of cardiac or respiratory ARREST Do not call a "code blue"   In the event of cardiac or respiratory ARREST Do not perform Intubation, CPR, defibrillation or ACLS   In the event of cardiac or respiratory ARREST Use medication by any route, position, wound care, and other measures to relive pain and suffering. May use  oxygen, suction and manual treatment of airway obstruction as needed for comfort.      12/22/18 1902        Code Status History    Date Active Date Inactive Code Status Order ID Comments User Context   12/21/2018 1656 12/22/2018 1902 Full Code [id number]  Gladstone Lighter, MD Inpatient   05/18/2017 1412 05/20/2017 1654 Full Code [id number]  Baxter Hire, MD Inpatient   07/25/2015 1735 07/27/2015 1402 Full Code [id number]  Lance Coon, MD Inpatient      TOTAL TIME TAKING CARE OF THIS PATIENT ON DAY OF DISCHARGE: more than 30 minutes.   Epifanio Lesches M.D on 12/26/2018 at 8:39 AM  Between 7am to 6pm - Pager - [phone]  After 6pm go to [email or web address] - password [email or web address] [facility]  Office  [phone]  CC: Primary care physician; Maryland Pink, MD  Note: This dictation was prepared with Dragon dictation along with smaller phrase technology. Any transcriptional errors that result from this process are unintentional.

## 2018-12-26 NOTE — Progress Notes (Signed)
Patient is stable for discharge.

## 2018-12-26 NOTE — Clinical Social Work Placement (Signed)
   CLINICAL SOCIAL WORK PLACEMENT  NOTE  Date:  12/26/2018  Patient Details  Name: Kelsey Morrison MRN: [id number] Date of Birth: [birth year]  Clinical Social Work is seeking post-discharge placement for this patient at the [place] level of care (*CSW will initial, date and re-position this form in  chart as items are completed):  Yes   Patient/family provided with [facility] Work Department's list of facilities offering this level of care within the geographic area requested by the patient (or if unable, by the patient's family).  Yes   Patient/family informed of their freedom to choose among providers that offer the needed level of care, that participate in Medicare, Medicaid or managed care program needed by the patient, have an available bed and are willing to accept the patient.  Yes   Patient/family informed of [facility]'s ownership interest in [facility] and [facility], as well as of the fact that they are under no obligation to receive care at these facilities.  PASRR submitted to EDS on 12/23/18     PASRR number received on       Existing PASRR number confirmed on 12/23/18     FL2 transmitted to all facilities in geographic area requested by pt/family on 12/23/18     FL2 transmitted to all facilities within larger geographic area on       Patient informed that his/her managed care company has contracts with or will negotiate with certain facilities, including the following:        Yes   Patient/family informed of bed offers received.  Patient chooses bed at [facility]     Physician recommends and patient chooses bed at      Patient to be transferred to [facility] on  .  Patient to be transferred to facility by [facility]     Patient family notified on   of transfer.  Name of family member notified:        PHYSICIAN Please sign FL2, Please prepare prescriptions, Please prepare  priority discharge summary, including medications, Please sign DNR     Additional Comment:    _______________________________________________ Zettie Pho, LCSW 12/26/2018, 4:36 PM

## 2019-05-02 ENCOUNTER — Other Ambulatory Visit
Admission: RE | Admit: 2019-05-02 | Discharge: 2019-05-02 | Disposition: A | Discharge status: Home / Self Care | Payer: Medicare Other | Source: Skilled Nursing Facility | Attending: Family Medicine | Admitting: Family Medicine

## 2019-05-02 DIAGNOSIS — R5383 Other fatigue: Secondary | ICD-10-CM | POA: Diagnosis present

## 2019-05-02 LAB — URINALYSIS, COMPLETE (UACMP) WITH MICROSCOPIC
Bilirubin Urine: NEGATIVE
Glucose, UA: NEGATIVE mg/dL
Hgb urine dipstick: NEGATIVE
Ketones, ur: NEGATIVE mg/dL
Leukocytes,Ua: NEGATIVE
Nitrite: NEGATIVE
Protein, ur: 30 mg/dL — AB
Specific Gravity, Urine: 1.021 (ref 1.005–1.030)
pH: 5 (ref 5.0–8.0)

## 2019-05-03 LAB — URINE CULTURE: Culture: NO GROWTH

## 2019-05-04 ENCOUNTER — Emergency Department
Admission: EM | Admit: 2019-05-04 | Discharge: 2019-05-04 | Disposition: A | Discharge status: Other Acute Inpatient Hospital | Payer: Medicare Other | Attending: Emergency Medicine | Admitting: Emergency Medicine

## 2019-05-04 ENCOUNTER — Emergency Department: Payer: Medicare Other

## 2019-05-04 ENCOUNTER — Other Ambulatory Visit: Payer: Self-pay

## 2019-05-04 ENCOUNTER — Inpatient Hospital Stay (HOSPITAL_COMMUNITY)
Admission: AD | Admit: 2019-05-04 | Discharge: 2019-05-07 | DRG: 177 | Disposition: A | Discharge status: Skilled Nursing Facility | Payer: Medicare Other | Source: Other Acute Inpatient Hospital | Attending: Internal Medicine | Admitting: Internal Medicine

## 2019-05-04 DIAGNOSIS — Z7982 Long term (current) use of aspirin: Secondary | ICD-10-CM

## 2019-05-04 DIAGNOSIS — G309 Alzheimer's disease, unspecified: Secondary | ICD-10-CM | POA: Diagnosis present

## 2019-05-04 DIAGNOSIS — E870 Hyperosmolality and hypernatremia: Secondary | ICD-10-CM | POA: Diagnosis present

## 2019-05-04 DIAGNOSIS — Z803 Family history of malignant neoplasm of breast: Secondary | ICD-10-CM | POA: Diagnosis not present

## 2019-05-04 DIAGNOSIS — I10 Essential (primary) hypertension: Secondary | ICD-10-CM | POA: Diagnosis present

## 2019-05-04 DIAGNOSIS — R0602 Shortness of breath: Secondary | ICD-10-CM | POA: Diagnosis present

## 2019-05-04 DIAGNOSIS — Z66 Do not resuscitate: Secondary | ICD-10-CM | POA: Diagnosis not present

## 2019-05-04 DIAGNOSIS — I4891 Unspecified atrial fibrillation: Secondary | ICD-10-CM | POA: Diagnosis not present

## 2019-05-04 DIAGNOSIS — F0281 Dementia in other diseases classified elsewhere with behavioral disturbance: Secondary | ICD-10-CM | POA: Diagnosis not present

## 2019-05-04 DIAGNOSIS — F0280 Dementia in other diseases classified elsewhere without behavioral disturbance: Secondary | ICD-10-CM | POA: Diagnosis present

## 2019-05-04 DIAGNOSIS — Z90710 Acquired absence of both cervix and uterus: Secondary | ICD-10-CM | POA: Diagnosis not present

## 2019-05-04 DIAGNOSIS — J1289 Other viral pneumonia: Secondary | ICD-10-CM | POA: Diagnosis present

## 2019-05-04 DIAGNOSIS — Z79899 Other long term (current) drug therapy: Secondary | ICD-10-CM | POA: Diagnosis not present

## 2019-05-04 DIAGNOSIS — E785 Hyperlipidemia, unspecified: Secondary | ICD-10-CM | POA: Diagnosis not present

## 2019-05-04 DIAGNOSIS — Z825 Family history of asthma and other chronic lower respiratory diseases: Secondary | ICD-10-CM

## 2019-05-04 DIAGNOSIS — F05 Delirium due to known physiological condition: Secondary | ICD-10-CM | POA: Diagnosis not present

## 2019-05-04 DIAGNOSIS — U071 COVID-19: Secondary | ICD-10-CM | POA: Diagnosis present

## 2019-05-04 DIAGNOSIS — Z96653 Presence of artificial knee joint, bilateral: Secondary | ICD-10-CM | POA: Diagnosis not present

## 2019-05-04 DIAGNOSIS — Z515 Encounter for palliative care: Secondary | ICD-10-CM | POA: Diagnosis present

## 2019-05-04 DIAGNOSIS — J9601 Acute respiratory failure with hypoxia: Secondary | ICD-10-CM | POA: Diagnosis present

## 2019-05-04 DIAGNOSIS — Z853 Personal history of malignant neoplasm of breast: Secondary | ICD-10-CM | POA: Insufficient documentation

## 2019-05-04 DIAGNOSIS — Z9011 Acquired absence of right breast and nipple: Secondary | ICD-10-CM

## 2019-05-04 DIAGNOSIS — E876 Hypokalemia: Secondary | ICD-10-CM | POA: Diagnosis not present

## 2019-05-04 LAB — CBC WITH DIFFERENTIAL/PLATELET
Abs Immature Granulocytes: 0.19 10*3/uL — ABNORMAL HIGH (ref 0.00–0.07)
Basophils Absolute: 0.1 10*3/uL (ref 0.0–0.1)
Basophils Relative: 1 %
Eosinophils Absolute: 0.1 10*3/uL (ref 0.0–0.5)
Eosinophils Relative: 1 %
HCT: 38 % (ref 36.0–46.0)
Hemoglobin: 10.9 g/dL — ABNORMAL LOW (ref 12.0–15.0)
Immature Granulocytes: 2 %
Lymphocytes Relative: 19 %
Lymphs Abs: 2.1 10*3/uL (ref 0.7–4.0)
MCH: 25.3 pg — ABNORMAL LOW (ref 26.0–34.0)
MCHC: 28.7 g/dL — ABNORMAL LOW (ref 30.0–36.0)
MCV: 88.2 fL (ref 80.0–100.0)
Monocytes Absolute: 0.4 10*3/uL (ref 0.1–1.0)
Monocytes Relative: 4 %
Neutro Abs: 7.8 10*3/uL — ABNORMAL HIGH (ref 1.7–7.7)
Neutrophils Relative %: 73 %
Platelets: 415 10*3/uL — ABNORMAL HIGH (ref 150–400)
RBC: 4.31 MIL/uL (ref 3.87–5.11)
RDW: 16 % — ABNORMAL HIGH (ref 11.5–15.5)
WBC: 10.6 10*3/uL — ABNORMAL HIGH (ref 4.0–10.5)
nRBC: 0 % (ref 0.0–0.2)

## 2019-05-04 LAB — COMPREHENSIVE METABOLIC PANEL
ALT: 16 U/L (ref 0–44)
AST: 27 U/L (ref 15–41)
Albumin: 2.2 g/dL — ABNORMAL LOW (ref 3.5–5.0)
Alkaline Phosphatase: 117 U/L (ref 38–126)
Anion gap: 11 (ref 5–15)
BUN: 35 mg/dL — ABNORMAL HIGH (ref 8–23)
CO2: 25 mmol/L (ref 22–32)
Calcium: 8.2 mg/dL — ABNORMAL LOW (ref 8.9–10.3)
Chloride: 119 mmol/L — ABNORMAL HIGH (ref 98–111)
Creatinine, Ser: 0.81 mg/dL (ref 0.44–1.00)
GFR calc Af Amer: >60 mL/min (ref >60)
GFR calc non Af Amer: >60 mL/min (ref >60)
Glucose, Bld: 90 mg/dL (ref 70–99)
Potassium: 2.9 mmol/L — ABNORMAL LOW (ref 3.5–5.1)
Sodium: 155 mmol/L — ABNORMAL HIGH (ref 135–145)
Total Bilirubin: 0.6 mg/dL (ref 0.3–1.2)
Total Protein: 7.3 g/dL (ref 6.5–8.1)

## 2019-05-04 LAB — PROTIME-INR
INR: 1.2 (ref 0.8–1.2)
Prothrombin Time: 15.3 seconds — ABNORMAL HIGH (ref 11.4–15.2)

## 2019-05-04 LAB — SARS CORONAVIRUS 2 BY RT PCR (HOSPITAL ORDER, PERFORMED IN ~~LOC~~ HOSPITAL LAB): SARS Coronavirus 2: POSITIVE — AB

## 2019-05-04 LAB — LACTIC ACID, PLASMA: Lactic Acid, Venous: 1.7 mmol/L (ref 0.5–1.9)

## 2019-05-04 LAB — LIPASE, BLOOD: Lipase: 32 U/L (ref 11–51)

## 2019-05-04 LAB — PROCALCITONIN: Procalcitonin: 0.15 ng/mL

## 2019-05-04 MED ORDER — TRAZODONE HCL 50 MG PO TABS
25.0000 mg | ORAL_TABLET | Freq: Every evening | ORAL | Status: DC | PRN
  Administered 2019-05-06: 22:00:00 25 mg via ORAL
  Filled 2019-05-04: qty 1

## 2019-05-04 MED ORDER — MIRTAZAPINE 7.5 MG PO TABS
7.5000 mg | ORAL_TABLET | Freq: Every day | ORAL | Status: DC
  Administered 2019-05-04 – 2019-05-06 (×3): 7.5 mg via ORAL
  Filled 2019-05-04 (×4): qty 1

## 2019-05-04 MED ORDER — OLANZAPINE 2.5 MG PO TABS
2.5000 mg | ORAL_TABLET | Freq: Every day | ORAL | Status: DC
  Administered 2019-05-04 – 2019-05-07 (×4): 2.5 mg via ORAL
  Filled 2019-05-04 (×4): qty 1

## 2019-05-04 MED ORDER — AMLODIPINE BESYLATE 5 MG PO TABS
5.0000 mg | ORAL_TABLET | Freq: Every day | ORAL | Status: DC
  Administered 2019-05-04 – 2019-05-07 (×4): 5 mg via ORAL
  Filled 2019-05-04 (×3): qty 1

## 2019-05-04 MED ORDER — SODIUM CHLORIDE 0.9% FLUSH
3.0000 mL | Freq: Two times a day (BID) | INTRAVENOUS | Status: DC
  Administered 2019-05-04 – 2019-05-06 (×4): 3 mL via INTRAVENOUS

## 2019-05-04 MED ORDER — ENOXAPARIN SODIUM 40 MG/0.4ML ~~LOC~~ SOLN
40.0000 mg | SUBCUTANEOUS | Status: DC
  Administered 2019-05-04 – 2019-05-05 (×2): 40 mg via SUBCUTANEOUS
  Filled 2019-05-04 (×2): qty 0.4

## 2019-05-04 MED ORDER — POTASSIUM CHLORIDE CRYS ER 20 MEQ PO TBCR
40.0000 meq | EXTENDED_RELEASE_TABLET | Freq: Once | ORAL | Status: DC
  Filled 2019-05-04 (×2): qty 2

## 2019-05-04 MED ORDER — GUAIFENESIN-DM 100-10 MG/5ML PO SYRP: 10.0000 mL | ORAL_SOLUTION | ORAL | Status: DC | PRN

## 2019-05-04 MED ORDER — DEXTROSE 5 % IV SOLN
INTRAVENOUS | Status: DC
  Administered 2019-05-04 – 2019-05-05 (×2): via INTRAVENOUS

## 2019-05-04 MED ORDER — SODIUM CHLORIDE 0.9 % IV BOLUS
500.0000 mL | Freq: Once | INTRAVENOUS | Status: AC
  Administered 2019-05-04: 500 mL via INTRAVENOUS

## 2019-05-04 MED ORDER — ONDANSETRON HCL 4 MG PO TABS: 4.0000 mg | ORAL_TABLET | Freq: Four times a day (QID) | ORAL | Status: DC | PRN

## 2019-05-04 MED ORDER — ASPIRIN EC 81 MG PO TBEC
81.0000 mg | DELAYED_RELEASE_TABLET | Freq: Every day | ORAL | Status: DC
  Administered 2019-05-04 – 2019-05-06 (×3): 81 mg via ORAL
  Filled 2019-05-04 (×3): qty 1

## 2019-05-04 MED ORDER — POTASSIUM CHLORIDE 10 MEQ/100ML IV SOLN
10.0000 meq | INTRAVENOUS | Status: AC
  Administered 2019-05-04 (×2): 10 meq via INTRAVENOUS
  Filled 2019-05-04: qty 100

## 2019-05-04 MED ORDER — ONDANSETRON HCL 4 MG/2ML IJ SOLN: 4.0000 mg | Freq: Four times a day (QID) | INTRAMUSCULAR | Status: DC | PRN

## 2019-05-04 MED ORDER — ACETAMINOPHEN 325 MG PO TABS: 650.0000 mg | ORAL_TABLET | Freq: Four times a day (QID) | ORAL | Status: DC | PRN

## 2019-05-04 MED ORDER — HYDROCOD POLST-CPM POLST ER 10-8 MG/5ML PO SUER
5.0000 mL | Freq: Two times a day (BID) | ORAL | Status: DC | PRN
  Administered 2019-05-05 – 2019-05-06 (×2): 5 mL via ORAL
  Filled 2019-05-04 (×2): qty 5

## 2019-05-04 MED ORDER — MEMANTINE HCL 10 MG PO TABS
10.0000 mg | ORAL_TABLET | Freq: Two times a day (BID) | ORAL | Status: DC
  Administered 2019-05-04 – 2019-05-07 (×6): 10 mg via ORAL
  Filled 2019-05-04 (×6): qty 1

## 2019-05-04 NOTE — ED Notes (Signed)
EMTALA reviewed by charge RN 

## 2019-05-04 NOTE — ED Provider Notes (Signed)
[facility] Emergency Department Provider Note ____________________________________________  First MD Initiated Contact with Patient 05/04/19 [id number]    (approximate)  I have reviewed the triage vital signs and the nursing notes.  HISTORY  Chief Complaint Shortness of Breath  EM caveat: Patient very confused, lethargic, unable to express any history  HPI Kelsey Morrison is a 85 y.o. female history of Alzheimer's, depression hypertension and recently diagnosed with positive COVID-19 coming from [facility]  Patient was on 5 L nasal cannula noted to have hypoxia, transferred to request of her family due to worsening status.  Reports that the patient has COVID-19, worsening lethargy, increasing oxygen requirement.  Of note patient is reported by EMS to be DNR and I am able to affirm this with the active DNR order that was brought with her  Past Medical History:  Diagnosis Date   Alzheimer's dementia ([place])    Anxiety    Cancer ([place])    breast cancer   Depression    Dry mouth    HLD (hyperlipidemia)    Hypertension    Inflammatory arthritis    seronegative   Osteoarthritis     Patient Active Problem List   Diagnosis Date Noted   Orthostatic hypotension 12/23/2018   A-fib ([place]) 12/21/2018   Sepsis ([place]) 05/18/2017   Syncope and collapse 07/25/2015   HTN (hypertension) 07/25/2015   Anxiety 07/25/2015   Alzheimer's dementia ([place]) 07/25/2015   Inflammatory arthritis 07/25/2015   Dry mouth 07/25/2015    Past Surgical History:  Procedure Laterality Date   ABDOMINAL HYSTERECTOMY     APPENDECTOMY     CHOLECYSTECTOMY     MASTECTOMY Right 1990   REPLACEMENT TOTAL KNEE BILATERAL      Prior to Admission medications   Medication Sig Start Date End Date Taking? Authorizing Provider  alendronate (FOSAMAX) 70 MG tablet Take 70 mg by mouth once a week. Take with a full glass of water on an empty stomach.   Yes [provider]  amLODipine (NORVASC) 5 MG tablet Take 5 mg by mouth daily.   Yes [provider]  antiseptic oral rinse (BIOTENE) LIQD 15 mLs by Mouth Rinse route at bedtime as needed for dry mouth.    Yes [provider]  aspirin EC 81 MG tablet Take 81 mg by mouth at bedtime.   Yes [provider]  calcium citrate (CALCITRATE - DOSED IN MG ELEMENTAL CALCIUM) 950 MG tablet Take 200 mg of elemental calcium by mouth daily.   Yes [provider]  diltiazem (CARDIZEM CD) 120 MG 24 hr capsule Take 1 capsule (120 mg total) by mouth daily. 12/23/18  Yes Sudini, Alveta Heimlich, MD  famotidine (PEPCID) 20 MG tablet Take 20 mg by mouth daily. 04/22/19  Yes [provider]  memantine (NAMENDA) 10 MG tablet Take 10 mg by mouth 2 (two) times daily.   Yes [provider]  metoprolol tartrate (LOPRESSOR) 25 MG tablet Take 1 tablet (25 mg total) by mouth 2 (two) times daily. 12/25/18  Yes Sudini, Alveta Heimlich, MD  mirtazapine (REMERON) 7.5 MG tablet Take 7.5 mg by mouth at bedtime.   Yes [provider]  OLANZapine (ZYPREXA) 2.5 MG tablet Take 2.5 mg by mouth daily. 04/22/19  Yes [provider]  venlafaxine (EFFEXOR) 75 MG tablet Take 75 mg by mouth daily.   Yes [provider]  vitamin B-12 (CYANOCOBALAMIN) 1000 MCG tablet Take 1,000 mcg by mouth daily.   Yes [provider]  Allergies Celecoxib and Hydrocodone  Family History  Problem Relation Age of Onset   Breast cancer Mother    COPD Sister    Breast cancer Sister     Social History Social History   Tobacco Use   Smoking status: Never Smoker   Smokeless tobacco: Never Used  Substance Use Topics   Alcohol use: No   Drug use: No    Review of Systems   Called listed POC Bennie Hornbaker, no answer and no voicemail was available. ____________________________________________   PHYSICAL EXAM:  VITAL SIGNS: ED Triage Vitals  Enc Vitals Group     BP 05/04/19  0943 120/66     Pulse Rate 05/04/19 0943 96     Resp 05/04/19 0943 (!) 21     Temp 05/04/19 0943 98.3 F (36.8 C)     Temp Source 05/04/19 0943 Axillary     SpO2 05/04/19 0937 100 %     Weight --      Height --      Head Circumference --      Peak Flow --      Pain Score --      Pain Loc --      Pain Edu? --      Excl. in [place]? --     Constitutional: Patient is alert.  Her eyes are open.  She tracks, but she is nonverbal.  She sitting up.  She in no acute painful distress, but does have evidence of increased work of breathing with some slight tachypnea Eyes: Conjunctivae are normal. Head: Atraumatic. Nose: No congestion/rhinnorhea. Mouth/Throat: Mucous membranes are dry. Neck: No stridor.  Cardiovascular: Normal rate, regular rhythm. Grossly normal heart sounds.  Good peripheral circulation. Respiratory: Slight tachypnea.  Mildly diminished lung sounds throughout without wheezing.  Mild use of accessory muscles.  She does not appear in acute extremitas, but does appear at least mildly dyspneic.  Satting well in the mid to high 90s on nonrebreather. Gastrointestinal: Soft and nontender. No distention. Musculoskeletal: No lower extremity tenderness nor edema. Neurologic: Opens eyes, follows some commands only briefly.  She does not speak. Skin:  Skin is warm, dry and intact. No rash noted. Psychiatric: Mood and affect are very calm, somewhat sedate.  Does not show any anxiety or distress.  ____________________________________________   LABS (all labs ordered are listed, but only abnormal results are displayed)  Labs Reviewed  SARS CORONAVIRUS 2 (HOSPITAL ORDER, [facility]) - Abnormal; Notable for the following components:      Result Value   SARS Coronavirus 2 POSITIVE (*)    All other components within normal limits  COMPREHENSIVE METABOLIC PANEL - Abnormal; Notable for the following components:   Sodium 155 (*)    Potassium 2.9 (*)    Chloride 119  (*)    BUN 35 (*)    Calcium 8.2 (*)    Albumin 2.2 (*)    All other components within normal limits  CBC WITH DIFFERENTIAL/PLATELET - Abnormal; Notable for the following components:   WBC 10.6 (*)    Hemoglobin 10.9 (*)    MCH 25.3 (*)    MCHC 28.7 (*)    RDW 16.0 (*)    Platelets 415 (*)    Neutro Abs 7.8 (*)    Abs Immature Granulocytes 0.19 (*)    All other components within normal limits  PROTIME-INR - Abnormal; Notable for the following components:   Prothrombin Time 15.3 (*)    All other components  within normal limits  CULTURE, BLOOD (ROUTINE X 2)  CULTURE, BLOOD (ROUTINE X 2)  URINE CULTURE  LACTIC ACID, PLASMA  LIPASE, BLOOD  PROCALCITONIN  URINALYSIS, COMPLETE (UACMP) WITH MICROSCOPIC   ____________________________________________  EKG  EKG reviewed by me, agree with computer interpretation ____________________________________________  RADIOLOGY  Dg Chest Port 1 View  Result Date: 05/04/2019 CLINICAL DATA:  Shortness of breath and poor oxygen saturation. History of coronavirus infection. EXAM: PORTABLE CHEST 1 VIEW COMPARISON:  12/21/2018 FINDINGS: Artifact overlies the chest. Heart size remains normal. There is atherosclerosis of the aorta. There are newly seen widespread patchy pulmonary infiltrates without dense consolidation. The findings could be consistent with viral or bacterial pneumonia. No visible effusion. Chronic degenerative changes of the left shoulder. No acute bone finding. IMPRESSION: Newly seen widespread patchy pulmonary infiltrates which could be consistent with viral or bacterial pneumonia. No dense consolidation, lobar collapse or visible pleural effusion. Given the history of coronavirus infection, findings are consistent with that diagnosis. Electronically Signed   By: Nelson Chimes M.D.   On: 05/04/2019 10:54    ____________________________________________   PROCEDURES  Procedure(s) performed: None  Procedures  Critical Care  performed: Yes, see critical care note(s)  CRITICAL CARE Performed by: Delman Kitten   Total critical care time: 30 minutes  Critical care time was exclusive of separately billable procedures and treating other patients.  Critical care was necessary to treat or prevent imminent or life-threatening deterioration.  Critical care was time spent personally by me on the following activities: development of treatment plan with patient and/or surrogate as well as nursing, discussions with consultants, evaluation of patient's response to treatment, examination of patient, obtaining history from patient or surrogate, ordering and performing treatments and interventions, ordering and review of laboratory studies, ordering and review of radiographic studies, pulse oximetry and re-evaluation of patient's condition.  Patient COVID positive, respiratory requirement including nonrebreather.  The patient is at very high risk for decompensation.  Discussion with family regarding CODE STATUS, patient is DNR.  Requiring transfer to [place] [facility] at [facility] ____________________________________________   [place] / [facility] / ED COURSE  Pertinent labs & imaging results that were available during my care of the patient were reviewed by me and considered in my medical decision making (see chart for details).     Clinical Course as of May 03 1618  Tue May 04, 2019  1109 Per [facility] hospitalist.Dr. Letta Pate advises that the patient needs to have a positive COVID-19 test today in order to be taken to [facility], if today's test is negative given her report that she had a positive test a couple weeks ago she would not be a candidate to come to [facility].  Patient requires positive test today in order to go to [facility] per [facility] hospitalist service   [MQ]  1125 COVID positive reported by lab. Transfer request to  [facility] initiated.   [MQ]  1132 Accepted to [facility] by Dr. Lanier Prude.  Of note as the patient has positive COVID and low procalcitonin, [facility] team advises against antibiotic initiation as this was most likely viral pneumonia secondary to coronavirus   [MQ]    Clinical Course User Index [MQ] Delman Kitten, MD   ----------------------------------------- 10:56 AM on 05/04/2019 -----------------------------------------  Discussed the case with the patient's husband Kelsey Morrison.  He reports the patient has that her well that she does not want to have heroic measures or life-sustaining treatment if it appears that  she is critically ill.  She is DO NOT RESUSCITATE and DO NOT INTUBATE.  Husband and asked that we continue to care for her but not perform anything that would be painful or considered heroic type of care.  He does wish for [place] to focus primarily on making sure that she is comfortable and secondarily on medical treatment.  Kelsey Morrison was evaluated in Emergency Department on 05/04/2019 for the symptoms described in the history of present illness. She was evaluated in the context of the global COVID-19 pandemic, which necessitated consideration that the patient might be at risk for infection with the SARS-CoV-2 virus that causes COVID-19. Institutional protocols and algorithms that pertain to the evaluation of patients at risk for COVID-19 are in a state of rapid change based on information released by regulatory bodies including the CDC and federal and state organizations. These policies and algorithms were followed during the patient's care in the ED.  ____________________________________________   FINAL CLINICAL IMPRESSION(S) / ED DIAGNOSES  Final diagnoses:  COVID-19 virus infection        Note:  This document was prepared using Dragon voice recognition software and may include unintentional dictation errors       Delman Kitten, MD 05/04/19 1619

## 2019-05-04 NOTE — ED Notes (Signed)
Pt had stool in diaper.  Pt cleaned and depends changed.  Pt resting comfortably at this time.

## 2019-05-04 NOTE — H&P (Signed)
HISTORY AND PHYSICAL       PATIENT DETAILS Name: Kelsey Morrison Age: 85 y.o. Sex: female Date of Birth: [birth year] Admit Date: 05/04/2019 QMV:HQIONGE, Jeneen Rinks, MD   Patient coming from: [place]:  SOB, confusion and lethary  HPI: Kelsey Morrison is a 85 y.o. female with medical history significant of dementia, HTN-a resident of [facility] at [employer] apparently was diagnosed with COVID-19 approximately 3 weeks back.  Patient was transferred to [facility] emergency room for worsening lethargy and shortness of breath.  She was found to be profoundly hypoxic in the emergency room and started on 100% nonrebreather mask.  [facility] hospital[facility] service at [facility] was consulted for admission.  During my evaluation-patient appeared comfortable-she was weaned off the 100% nonrebreather mask to oxygen via nasal cannula.  She did not appear in any distress.  She is very pleasantly confused-is able to tell me her name and her husband's name-but apart from that she is not able to partake in the history taking process.   ED Course:  Given supportive care in the emergency room with oxygen and IV fluids.  Subsequently transferred to the triad hospitalist service at [facility].  Note: Lives at: SNF Mobility: Not known Chronic Indwelling Foley:no   REVIEW OF SYSTEMS:  Unable to obtain review of systems as patient has advanced dementia-no family at bedside-family also unreachable by phone  ALLERGIES:   Allergies  Allergen Reactions  . Celecoxib Other (See Comments)  . Hydrocodone Other (See Comments)    Altered mental status    PAST MEDICAL HISTORY: Past Medical History:  Diagnosis Date  . Alzheimer's dementia ([place])   . Anxiety   . Cancer [facility])    breast cancer  . Depression   . Dry mouth   . HLD (hyperlipidemia)   . Hypertension   . Inflammatory arthritis    seronegative  . Osteoarthritis     PAST SURGICAL HISTORY: Past Surgical  History:  Procedure Laterality Date  . ABDOMINAL HYSTERECTOMY    . APPENDECTOMY    . CHOLECYSTECTOMY    . MASTECTOMY Right 1990  . REPLACEMENT TOTAL KNEE BILATERAL      MEDICATIONS AT HOME: Prior to Admission medications   Medication Sig Start Date End Date Taking? Authorizing Provider  alendronate (FOSAMAX) 70 MG tablet Take 70 mg by mouth once a week. Take with a full glass of water on an empty stomach.   Yes [provider]  amLODipine (NORVASC) 5 MG tablet Take 5 mg by mouth daily.   Yes [provider]  antiseptic oral rinse (BIOTENE) LIQD 15 mLs by Mouth Rinse route at bedtime as needed for dry mouth.    Yes [provider]  aspirin EC 81 MG tablet Take 81 mg by mouth at bedtime.   Yes [provider]  calcium citrate (CALCITRATE - DOSED IN MG ELEMENTAL CALCIUM) 950 MG tablet Take 200 mg of elemental calcium by mouth daily.   Yes [provider]  diltiazem (CARDIZEM CD) 120 MG 24 hr capsule Take 1 capsule (120 mg total) by mouth daily. 12/23/18  Yes Sudini, Alveta Heimlich, MD  famotidine (PEPCID) 20 MG tablet Take 20 mg by mouth daily. 04/22/19  Yes [provider]  memantine (NAMENDA) 10 MG tablet Take 10 mg by mouth 2 (two) times daily.   Yes [provider]  metoprolol tartrate (LOPRESSOR) 25 MG tablet Take 1 tablet (25 mg total) by mouth 2 (  two) times daily. 12/25/18  Yes Sudini, Alveta Heimlich, MD  mirtazapine (REMERON) 7.5 MG tablet Take 7.5 mg by mouth at bedtime.   Yes [provider]  OLANZapine (ZYPREXA) 2.5 MG tablet Take 2.5 mg by mouth daily. 04/22/19  Yes [provider]  venlafaxine (EFFEXOR) 75 MG tablet Take 75 mg by mouth daily.   Yes [provider]  vitamin B-12 (CYANOCOBALAMIN) 1000 MCG tablet Take 1,000 mcg by mouth daily.   Yes [provider]    FAMILY HISTORY: Family History  Problem Relation Age of Onset  . Breast cancer Mother   . COPD Sister   . Breast cancer Sister        SOCIAL HISTORY:  reports that she has never smoked. She has never used smokeless tobacco. She reports that she does not drink alcohol or use drugs.  PHYSICAL EXAM: Blood pressure (!) 116/58, pulse 96, temperature 98.4 F (36.9 C), temperature source Axillary, resp. rate 16, height 5\' 2"  (1.575 m), weight 48.8 kg, SpO2 97 %.  General appearance :Awake, pleasantly confused, not in any distress.  Eyes:, pupils equally reactive to light and accomodation,no scleral icterus.Pink conjunctiva HEENT: Atraumatic and Normocephalic Neck: supple, no JVD.  Resp:Good air entry bilaterally, few bibasilar rales CVS: S1 S2 regular GI: Bowel sounds present, Non tender and not distended with no gaurding, rigidity or rebound. Extremities: B/L Lower Ext shows no edema, both legs are warm to touch Neurology: Analyzed weakness-appears nonfocal Musculoskeletal:gait appears to be normal.No digital cyanosis Skin:No Rash, warm and dry Wounds:N/A  LABS ON ADMISSION:  I have personally reviewed following labs and imaging studies  CBC: Recent Labs  Lab 05/04/19 0952  WBC 10.6*  NEUTROABS 7.8*  HGB 10.9*  HCT 38.0  MCV 88.2  PLT 415*    Basic Metabolic Panel: Recent Labs  Lab 05/04/19 0952  NA 155*  K 2.9*  CL 119*  CO2 25  GLUCOSE 90  BUN 35*  CREATININE 0.81  CALCIUM 8.2*    GFR: Estimated Creatinine Clearance: 41.3 mL/min (by C-G formula based on SCr of 0.81 mg/dL).  Liver Function Tests: Recent Labs  Lab 05/04/19 0952  AST 27  ALT 16  ALKPHOS 117  BILITOT 0.6  PROT 7.3  ALBUMIN 2.2*   Recent Labs  Lab 05/04/19 0952  LIPASE 32   No results for input(s): AMMONIA in the last 168 hours.  Coagulation Profile: Recent Labs  Lab 05/04/19 0952  INR 1.2    Cardiac Enzymes: No results for input(s): CKTOTAL, CKMB, CKMBINDEX, TROPONINI in the last 168 hours.  BNP (last 3 results) No results for input(s): PROBNP in the last 8760 hours.  HbA1C: No results for input(s):  HGBA1C in the last 72 hours.  CBG: No results for input(s): GLUCAP in the last 168 hours.  Lipid Profile: No results for input(s): CHOL, HDL, LDLCALC, TRIG, CHOLHDL, LDLDIRECT in the last 72 hours.  Thyroid Function Tests: No results for input(s): TSH, T4TOTAL, FREET4, T3FREE, THYROIDAB in the last 72 hours.  Anemia Panel: No results for input(s): VITAMINB12, FOLATE, FERRITIN, TIBC, IRON, RETICCTPCT in the last 72 hours.  Urine analysis:    Component Value Date/Time   COLORURINE AMBER (A) 05/02/2019 1830   APPEARANCEUR CLOUDY (A) 05/02/2019 1830   APPEARANCEUR Clear 02/13/2014 1628   LABSPEC 1.021 05/02/2019 1830   LABSPEC 1.004 02/13/2014 1628   PHURINE 5.0 05/02/2019 1830   GLUCOSEU NEGATIVE 05/02/2019 1830   GLUCOSEU Negative 02/13/2014 1628   HGBUR NEGATIVE 05/02/2019 1830   BILIRUBINUR  NEGATIVE 05/02/2019 1830   BILIRUBINUR Negative 02/13/2014 1628   [place] 05/02/2019 1830   PROTEINUR 30 (A) 05/02/2019 1830   NITRITE NEGATIVE 05/02/2019 [place] 05/02/2019 1830   LEUKOCYTESUR Negative 02/13/2014 1628    Sepsis Labs: Lactic Acid, Venous    Component Value Date/Time   LATICACIDVEN 1.7 05/04/2019 5701     Microbiology: Recent Results (from the past 240 hour(s))  Urine Culture     Status: None   Collection Time: 05/02/19  6:30 PM  Result Value Ref Range Status   Specimen Description   Final    URINE, RANDOM Performed at [facility], [address], [place], [place] [address]    Special Requests   Final    NONE Performed at [facility], [address] [place], [place] [address]    Culture   Final    NO GROWTH Performed at [facility], [place] [address], [place], [place] [address]    Report Status 05/03/2019 FINAL  Final  SARS Coronavirus 2 (CEPHEID- Performed in [place] hospital lab), Hosp Order     Status: Abnormal   Collection Time: 05/04/19  9:52 AM  Result Value Ref Range Status   SARS  Coronavirus 2 POSITIVE (A) NEGATIVE Final    Comment: RESULT CALLED TO, READ BACK BY AND VERIFIED WITH: Wallie Char RN AT 7622 ON 05/04/19 SG (NOTE) If result is NEGATIVE SARS-CoV-2 target nucleic acids are NOT DETECTED. The SARS-CoV-2 RNA is generally detectable in upper and lower  respiratory specimens during the acute phase of infection. The lowest  concentration of SARS-CoV-2 viral copies this assay can detect is 250  copies / mL. A negative result does not preclude SARS-CoV-2 infection  and should not be used as the sole basis for treatment or other  patient management decisions.  A negative result may occur with  improper specimen collection / handling, submission of specimen other  than nasopharyngeal swab, presence of viral mutation(s) within the  areas targeted by this assay, and inadequate number of viral copies  (<250 copies / mL). A negative result must be combined with clinical  observations, patient history, and epidemiological information. If result is POSITIVE SARS-CoV-2 target nucleic acids are DETECTED.  The SARS-CoV-2 RNA is generally detectable in upper and lower  respiratory specimens during the acute phase of infection.  Positive  results are indicative of active infection with SARS-CoV-2.  Clinical  correlation with patient history and other diagnostic information is  necessary to determine patient infection status.  Positive results do  not rule out bacterial infection or co-infection with other viruses. If result is PRESUMPTIVE POSTIVE SARS-CoV-2 nucleic acids MAY BE PRESENT.   A presumptive positive result was obtained on the submitted specimen  and confirmed on repeat testing.  While 2019 novel coronavirus  (SARS-CoV-2) nucleic acids may be present in the submitted sample  additional confirmatory testing may be necessary for epidemiological  and / or clinical management purposes  to differentiate between  SARS-CoV-2 and other Sarbecovirus currently known  to infect humans.  If clinically indicated additional testing with an alternate test  methodology [phone])  is advised. The SARS-CoV-2 RNA is generally  detectable in upper and lower respiratory specimens during the acute  phase of infection. The expected result is Negative. Fact Sheet for Patients:  StrictlyIdeas.no Fact Sheet for Healthcare Providers: BankingDealers.co.za This test is not yet approved or cleared by the [place] FDA and has been authorized for detection and/or diagnosis  of SARS-CoV-2 by FDA under an Emergency Use Authorization (EUA).  This EUA will remain in effect (meaning this test can be used) for the duration of the COVID-19 declaration under Section 564(b)(1) of the Act, 21 U.S.C. section 360bbb-3(b)(1), unless the authorization is terminated or revoked sooner. Performed at [facility], [address] [place], [place] [address]       RADIOLOGIC STUDIES ON ADMISSION: Dg Chest [facility] 1 View  Result Date: 05/04/2019 CLINICAL DATA:  Shortness of breath and poor oxygen saturation. History of coronavirus infection. EXAM: PORTABLE CHEST 1 VIEW COMPARISON:  12/21/2018 FINDINGS: Artifact overlies the chest. Heart size remains normal. There is atherosclerosis of the aorta. There are newly seen widespread patchy pulmonary infiltrates without dense consolidation. The findings could be consistent with viral or bacterial pneumonia. No visible effusion. Chronic degenerative changes of the left shoulder. No acute bone finding. IMPRESSION: Newly seen widespread patchy pulmonary infiltrates which could be consistent with viral or bacterial pneumonia. No dense consolidation, lobar collapse or visible pleural effusion. Given the history of coronavirus infection, findings are consistent with that diagnosis. Electronically Signed   By: Nelson Chimes M.D.   On: 05/04/2019 10:54    I have personally reviewed images of chest  xray-bilateral pulmonary infiltrates  EKG:  Personally reviewed.  NSR  ASSESSMENT AND PLAN: Acute hypoxic respiratory failure secondary to COVID-19 viral pneumonia: Appears currently comfortable at rest-suspect does not require NRB-RN currently titrating to [place].  Okay to keep O2 saturation above 88-90%.  She is very frail-and has advanced dementia-and is not a candidate for advanced therapies including immunomodulators.  Continue to provide supportive care-obtain SLP/PT eval.  Attempt to titrate off oxygen as tolerated  Hypernatremia: Likely secondary to poor oral intake-continue D5W.  Hypokalemia: Replete and recheck  Hypertension: Continue amlodipine-follow.  Dementia: At risk of delirium-continue home medications.  Palliative care: DNR in place (unable to reach husband-called son who I talked on the phone).  Will provide supportive care-if patient deteriorates-appropriate candidate for comfort measures rather than any further escalation in care.  Further plan will depend as patient's clinical course evolves and further radiologic and laboratory data become available. Patient will be monitored closely.  Above noted plan was discussed with patient's son over the phone.  CONSULTS: None  DVT Prophylaxis: Prophylactic Lovenox   Code Status: DNR  Disposition Plan:  Discharge back home to SNF possibly in 3-4 days, depending on clinical course  Admission status: Inpatient  going to medical floor  The medical decision making on this patient was of high complexity and the patient is at high risk for clinical deterioration, therefore this is a level 3 visit.   Total time spent  55 minutes.Greater than 50% of this time was spent in counseling, explanation of diagnosis, planning of further management, and coordination of care.  Severity of illness:  I certify that at the point of admission it is my clinical judgment that the patient will require inpatient hospital care spanning beyond 2  midnights from the point of admission due to high intensity of service, high risk for further deterioration and high frequency of surveillance required.  Oren Binet [facility] Pager [phone]  If 7PM-7AM, please contact night-coverage  Please page via [email or web address]  Go to [email or web address] and use [place]'s universal password to access. If you do not have the password, please contact the hospital operator.  Locate the [facility] provider you are looking for under [facility] and page to a number that you can be directly reached.  If you still have difficulty reaching the provider, please page the [facility] (Director on Call) for the Hospitalists listed on amion for assistance.  05/04/2019, 6:17 PM

## 2019-05-04 NOTE — ED Notes (Signed)
Pt unable to sign d/t hand contractures.

## 2019-05-04 NOTE — Progress Notes (Addendum)
RN attempted swallow screen, patient unable to tolerate 3 OZ of water at this time without coughing. MD made aware that she is unable to take PO potassium at this time.

## 2019-05-04 NOTE — ED Triage Notes (Signed)
Pt from [facility] per family request due to SOB and low O2.

## 2019-05-04 NOTE — Progress Notes (Signed)
This RN attempted to call patient's husband at [phone]. No answer. Called patient's son, Dneil. Introduced myself as Scientist, physiological. Verified that the above number was the correct number for the patient's husband. Updated. Questions encouraged and answered.

## 2019-05-04 NOTE — Progress Notes (Signed)
Patient admitted to 142 via carelink. Her vitals are stable at this time. She is alert to self only. She is on 15L NRB and seems comfortable. Will continue to monitor. MD Ghimire at bedside to assess.

## 2019-05-04 NOTE — ED Notes (Signed)
Updated pt's husband via phone.  

## 2019-05-05 DIAGNOSIS — G309 Alzheimer's disease, unspecified: Secondary | ICD-10-CM

## 2019-05-05 DIAGNOSIS — F0281 Dementia in other diseases classified elsewhere with behavioral disturbance: Secondary | ICD-10-CM

## 2019-05-05 LAB — COMPREHENSIVE METABOLIC PANEL
ALT: 16 U/L (ref 0–44)
AST: 22 U/L (ref 15–41)
Albumin: 2 g/dL — ABNORMAL LOW (ref 3.5–5.0)
Alkaline Phosphatase: 86 U/L (ref 38–126)
Anion gap: 6 (ref 5–15)
BUN: 28 mg/dL — ABNORMAL HIGH (ref 8–23)
CO2: 25 mmol/L (ref 22–32)
Calcium: 7.9 mg/dL — ABNORMAL LOW (ref 8.9–10.3)
Chloride: 121 mmol/L — ABNORMAL HIGH (ref 98–111)
Creatinine, Ser: 0.73 mg/dL (ref 0.44–1.00)
GFR calc Af Amer: >60 mL/min (ref >60)
GFR calc non Af Amer: >60 mL/min (ref >60)
Glucose, Bld: 82 mg/dL (ref 70–99)
Potassium: 2.5 mmol/L — CL (ref 3.5–5.1)
Sodium: 152 mmol/L — ABNORMAL HIGH (ref 135–145)
Total Bilirubin: 0.4 mg/dL (ref 0.3–1.2)
Total Protein: 6.2 g/dL — ABNORMAL LOW (ref 6.5–8.1)

## 2019-05-05 LAB — CBC WITH DIFFERENTIAL/PLATELET
Abs Immature Granulocytes: 0.15 10*3/uL — ABNORMAL HIGH (ref 0.00–0.07)
Basophils Absolute: 0 10*3/uL (ref 0.0–0.1)
Basophils Relative: 0 %
Eosinophils Absolute: 0.3 10*3/uL (ref 0.0–0.5)
Eosinophils Relative: 3 %
HCT: 31.2 % — ABNORMAL LOW (ref 36.0–46.0)
Hemoglobin: 8.9 g/dL — ABNORMAL LOW (ref 12.0–15.0)
Immature Granulocytes: 2 %
Lymphocytes Relative: 21 %
Lymphs Abs: 1.9 10*3/uL (ref 0.7–4.0)
MCH: 25.5 pg — ABNORMAL LOW (ref 26.0–34.0)
MCHC: 28.5 g/dL — ABNORMAL LOW (ref 30.0–36.0)
MCV: 89.4 fL (ref 80.0–100.0)
Monocytes Absolute: 0.3 10*3/uL (ref 0.1–1.0)
Monocytes Relative: 3 %
Neutro Abs: 6.2 10*3/uL (ref 1.7–7.7)
Neutrophils Relative %: 71 %
Platelets: 322 10*3/uL (ref 150–400)
RBC: 3.49 MIL/uL — ABNORMAL LOW (ref 3.87–5.11)
RDW: 15.9 % — ABNORMAL HIGH (ref 11.5–15.5)
WBC: 8.8 10*3/uL (ref 4.0–10.5)
nRBC: 0 % (ref 0.0–0.2)

## 2019-05-05 LAB — ABO/RH: ABO/RH(D): A NEG

## 2019-05-05 LAB — FERRITIN: Ferritin: 738 ng/mL — ABNORMAL HIGH (ref 11–307)

## 2019-05-05 LAB — C-REACTIVE PROTEIN: CRP: 23 mg/dL — ABNORMAL HIGH (ref <1.0)

## 2019-05-05 MED ORDER — POTASSIUM CHLORIDE 2 MEQ/ML IV SOLN
INTRAVENOUS | Status: DC
  Administered 2019-05-05: 10:00:00 via INTRAVENOUS
  Filled 2019-05-05 (×4): qty 1000

## 2019-05-05 MED ORDER — POTASSIUM CHLORIDE 10 MEQ/100ML IV SOLN
10.0000 meq | INTRAVENOUS | Status: AC
  Administered 2019-05-05 (×4): 10 meq via INTRAVENOUS
  Filled 2019-05-05 (×5): qty 100

## 2019-05-05 NOTE — Progress Notes (Addendum)
PROGRESS NOTE                                                                                                                                                                                                             Patient Demographics:    Kelsey Morrison, is a 85 y.o. female, DOB - 12/27/1933, YQI:[id number]  Outpatient Primary MD for the patient is Maryland Pink, MD    LOS - 1  No chief complaint on file.      Brief Narrative: Patient is a 85 y.o. female with PMHx of dementia, HTN-who was diagnosed with COVID-19 approximately 3 weeks back-presented with worsening lethargy, shortness of breath-she was found to have COVID-19 viral pneumonia, hypernatremia and hypokalemia.  She was subsequently admitted to the triad hospitalist service at [facility].   Subjective:    Kelsey Morrison today remains pleasantly confused.  She is just on 1-2 L of oxygen-and is very comfortable.  No major events overnight per RN.   Assessment  & Plan :   Acute Hypoxic Resp Failure due to Covid 19 Viral pneumonia: Stable-very minimal oxygen requirements this morning-seems to be just improving with supportive care.  Surprisingly her CRP significantly elevated-however clinically she appears stable.  Will not plan on any further escalation in care-if in the unlikely event she deteriorates-it is probably appropriate to transition to comfort measures.  Not sure-given that she is almost 3 weeks out from her initial diagnosis-any of the medications including immunomodulators will be effective at this point.   COVID-19 Labs:  Recent Labs    05/05/19 0305  FERRITIN 738*  CRP 23.0*    Lab Results  Component Value Date   SARSCOV2NAA POSITIVE (A) 05/04/2019     COVID-19 Medications: None  Hypernatremia: Slowly improving-secondary to dehydration-continue with D5 W infusion.  Hypokalemia: Continue to replete-recheck tomorrow morning.  Hypertension:  Controlled-continue with amlodipine  Dementia with delirium: At risk for delirium-remains very pleasantly confused-continue with Namenda, olanzapine and Remeron  Debility/deconditioning: Suspect debilitated at baseline-probably worsened due to dehydration and COVID-19 viral pneumonia.  Avoid physical therapy and SLP evaluation.  Palliative care: DNR in place-given her overall frailty and poor overall health-she is not a candidate for aggressive care.  If she were to deteriorate-appropriate to transition to comfort measures after discussion with family.    ABG: No results found for: PHART, PCO2ART,  PO2ART, HCO3, TCO2, ACIDBASEDEF, O2SAT  Condition - Extremely Guarded  Family Communication  :  Unable to reach spouse (left voicemail), unable to reach son as well (left voicemail)  Code Status :  DNR  Diet : Heart Healthy diet  Disposition Plan  :  Remain inpatient  Consults  :  PCCM  Procedures  :  None  DVT Prophylaxis  :  Lovenox  Lab Results  Component Value Date   PLT 322 05/05/2019    Inpatient Medications  Scheduled Meds: . amLODipine  5 mg Oral Daily  . aspirin EC  81 mg Oral QHS  . enoxaparin (LOVENOX) injection  40 mg Subcutaneous Q24H  . memantine  10 mg Oral BID  . mirtazapine  7.5 mg Oral QHS  . OLANZapine  2.5 mg Oral Daily  . potassium chloride  40 mEq Oral Once  . sodium chloride flush  3 mL Intravenous Q12H   Continuous Infusions: . dextrose 5 % 1,000 mL with potassium chloride 40 mEq infusion 75 mL/hr at 05/05/19 0946  . potassium chloride 10 mEq (05/05/19 0943)   PRN Meds:.acetaminophen, chlorpheniramine-HYDROcodone, guaiFENesin-dextromethorphan, ondansetron **OR** ondansetron (ZOFRAN) IV, traZODone  Antibiotics  :    Anti-infectives (From admission, onward)   None       Time Spent in minutes 35   Oren Binet M.D on 05/05/2019 at 9:59 AM  To page go to [email or web address] - use universal password  [facility] -  Office  [phone]     Admit date - 05/04/2019    1    Objective:   Vitals:   05/05/19 0000 05/05/19 0200 05/05/19 0400 05/05/19 0800  BP:   (!) 122/54 121/64  Pulse:  99  88  Resp:  (!) 21  13  Temp: 98.8 F (37.1 C)  99 F (37.2 C)   TempSrc: Axillary  Axillary   SpO2:  96%  97%  Weight:      Height:        Wt Readings from Last 3 Encounters:  05/04/19 48.8 kg  12/21/18 65.8 kg  11/20/18 65 kg     Intake/Output Summary (Last 24 hours) at 05/05/2019 0959 Last data filed at 05/05/2019 0953 Gross per 24 hour  Intake 829.29 ml  Output 225 ml  Net 604.29 ml     Physical Exam Gen Exam: pleasantly confused-but not in any distress.   HEENT:atraumatic, normocephalic Chest: B/L clear to auscultation anteriorly CVS:S1S2 regular Abdomen:soft non tender, non distended Extremities:no edema Neurology: difficult to examine given dementia but appears nonfocal Skin: no rash   Data Review:    CBC Recent Labs  Lab 05/04/19 0952 05/05/19 0305  WBC 10.6* 8.8  HGB 10.9* 8.9*  HCT 38.0 31.2*  PLT 415* 322  MCV 88.2 89.4  MCH 25.3* 25.5*  MCHC 28.7* 28.5*  RDW 16.0* 15.9*  LYMPHSABS 2.1 1.9  MONOABS 0.4 0.3  EOSABS 0.1 0.3  BASOSABS 0.1 0.0    Chemistries  Recent Labs  Lab 05/04/19 0952 05/05/19 0305  NA 155* 152*  K 2.9* 2.5*  CL 119* 121*  CO2 25 25  GLUCOSE 90 82  BUN 35* 28*  CREATININE 0.81 0.73  CALCIUM 8.2* 7.9*  AST 27 22  ALT 16 16  ALKPHOS 117 86  BILITOT 0.6 0.4   ------------------------------------------------------------------------------------------------------------------ No results for input(s): CHOL, HDL, LDLCALC, TRIG, CHOLHDL, LDLDIRECT in the last 72 hours.  No results found for: HGBA1C ------------------------------------------------------------------------------------------------------------------ No results for input(s): TSH, T4TOTAL, T3FREE, THYROIDAB in the last 72 hours.  Invalid input(s): FREET3  ------------------------------------------------------------------------------------------------------------------ Recent Labs    05/05/19 0305  FERRITIN 738*    Coagulation profile Recent Labs  Lab 05/04/19 0952  INR 1.2    No results for input(s): DDIMER in the last 72 hours.  Cardiac Enzymes No results for input(s): CKMB, TROPONINI, MYOGLOBIN in the last 168 hours.  Invalid input(s): CK ------------------------------------------------------------------------------------------------------------------    Component Value Date/Time   BNP 113.0 (H) 05/24/2017 1648    Micro Results Recent Results (from the past 240 hour(s))  Urine Culture     Status: None   Collection Time: 05/02/19  6:30 PM  Result Value Ref Range Status   Specimen Description   Final    URINE, RANDOM Performed at [facility], [address], [place], [place] [address]    Special Requests   Final    NONE Performed at [facility], [address], [place], [place] [address]    Culture   Final    NO GROWTH Performed at [facility], [place] [address] [place], [place] [address]    Report Status 05/03/2019 FINAL  Final  Blood Culture (routine x 2)     Status: None (Preliminary result)   Collection Time: 05/04/19  9:52 AM  Result Value Ref Range Status   Specimen Description BLOOD RIGHT ANTECUBITAL  Final   Special Requests   Final    BOTTLES DRAWN AEROBIC AND ANAEROBIC Blood Culture adequate volume   Culture   Final    NO GROWTH < 24 HOURS Performed at [facility], [address], [place], [place] [address]    Report Status PENDING  Incomplete  Blood Culture (routine x 2)     Status: None (Preliminary result)   Collection Time: 05/04/19  9:52 AM  Result Value Ref Range Status   Specimen Description BLOOD LEFT HAND  Final   Special Requests   Final    BOTTLES DRAWN AEROBIC AND ANAEROBIC Blood Culture adequate volume   Culture   Final    NO GROWTH < 24 HOURS  Performed at [facility], [address], [place],  [address]    Report Status PENDING  Incomplete  SARS Coronavirus 2 (CEPHEID- Performed in [place] hospital lab), Hosp Order     Status: Abnormal   Collection Time: 05/04/19  9:52 AM  Result Value Ref Range Status   SARS Coronavirus 2 POSITIVE (A) NEGATIVE Final    Comment: RESULT CALLED TO, READ BACK BY AND VERIFIED WITH: Wallie Char RN AT 0932 ON 05/04/19 SG (NOTE) If result is NEGATIVE SARS-CoV-2 target nucleic acids are NOT DETECTED. The SARS-CoV-2 RNA is generally detectable in upper and lower  respiratory specimens during the acute phase of infection. The lowest  concentration of SARS-CoV-2 viral copies this assay can detect is 250  copies / mL. A negative result does not preclude SARS-CoV-2 infection  and should not be used as the sole basis for treatment or other  patient management decisions.  A negative result may occur with  improper specimen collection / handling, submission of specimen other  than nasopharyngeal swab, presence of viral mutation(s) within the  areas targeted by this assay, and inadequate number of viral copies  (<250 copies / mL). A negative result must be combined with clinical  observations, patient history, and epidemiological information. If result is POSITIVE SARS-CoV-2 target nucleic acids are DETECTED.  The SARS-CoV-2 RNA is generally detectable in upper and lower  respiratory specimens during the acute phase of infection.  Positive  results are indicative of active infection with SARS-CoV-2.  Clinical  correlation with patient history and other diagnostic information is  necessary to determine patient infection status.  Positive results do  not rule out bacterial infection or co-infection with other viruses. If result is PRESUMPTIVE POSTIVE SARS-CoV-2 nucleic acids MAY BE PRESENT.   A presumptive positive result was obtained on the submitted specimen  and confirmed on  repeat testing.  While 2019 novel coronavirus  (SARS-CoV-2) nucleic acids may be present in the submitted sample  additional confirmatory testing may be necessary for epidemiological  and / or clinical management purposes  to differentiate between  SARS-CoV-2 and other Sarbecovirus currently known to infect humans.  If clinically indicated additional testing with an alternate test  methodology [phone])  is advised. The SARS-CoV-2 RNA is generally  detectable in upper and lower respiratory specimens during the acute  phase of infection. The expected result is Negative. Fact Sheet for Patients:  StrictlyIdeas.no Fact Sheet for Healthcare Providers: BankingDealers.co.za This test is not yet approved or cleared by the [place] FDA and has been authorized for detection and/or diagnosis of SARS-CoV-2 by FDA under an Emergency Use Authorization (EUA).  This EUA will remain in effect (meaning this test can be used) for the duration of the COVID-19 declaration under Section 564(b)(1) of the Act, 21 U.S.C. section 360bbb-3(b)(1), unless the authorization is terminated or revoked sooner. Performed at [facility], [address], [place], [place] [address]     Radiology Reports Dg Chest [place] 1 View  Result Date: 05/04/2019 CLINICAL DATA:  Shortness of breath and poor oxygen saturation. History of coronavirus infection. EXAM: PORTABLE CHEST 1 VIEW COMPARISON:  12/21/2018 FINDINGS: Artifact overlies the chest. Heart size remains normal. There is atherosclerosis of the aorta. There are newly seen widespread patchy pulmonary infiltrates without dense consolidation. The findings could be consistent with viral or bacterial pneumonia. No visible effusion. Chronic degenerative changes of the left shoulder. No acute bone finding. IMPRESSION: Newly seen widespread patchy pulmonary infiltrates which could be consistent with viral or bacterial  pneumonia. No dense consolidation, lobar collapse or visible pleural effusion. Given the history of coronavirus infection, findings are consistent with that diagnosis. Electronically Signed   By: Nelson Chimes M.D.   On: 05/04/2019 10:54

## 2019-05-05 NOTE — Progress Notes (Signed)
Spoke with husband.  Updated on patient condition and that she may be discharging soon to rest home.  He verbalized understanding and appreciation.

## 2019-05-05 NOTE — Evaluation (Signed)
Clinical/Bedside Swallow Evaluation Patient Details  Name: Kelsey Morrison MRN: [id number] Date of Birth: 05-20-1934  Today's Date: 05/05/2019 Time: SLP Start Time (ACUTE ONLY): 0859 SLP Stop Time (ACUTE ONLY): 0915 SLP Time Calculation (min) (ACUTE ONLY): 16 min  Past Medical History:  Past Medical History:  Diagnosis Date  . Alzheimer's dementia ([place])   . Anxiety   . Cancer [facility])    breast cancer  . Depression   . Dry mouth   . HLD (hyperlipidemia)   . Hypertension   . Inflammatory arthritis    seronegative  . Osteoarthritis    Past Surgical History:  Past Surgical History:  Procedure Laterality Date  . ABDOMINAL HYSTERECTOMY    . APPENDECTOMY    . CHOLECYSTECTOMY    . MASTECTOMY Right 1990  . REPLACEMENT TOTAL KNEE BILATERAL     HPI:  Pt is an 85 yo female who presented from SNF with increasing lethargy and SOB after being diagnosed with COVID-19 three weeks prior. PMH includes arthritis, HLD, dry mouth, depression, breast cancer, anxiety, and dementia.   Assessment / Plan / Recommendation Clinical Impression  Pt's appears to be capable of swallowing regardless of texture, with a solitary cough that by itself is not indicative of significant concern for aspiration. Despite demonstrating her abilities to masticate and swallow effectively, she often spits out formed boluses because she "doesn't like it." This is likely more cognitive in nature and preference based. Recommend continuing regular solids and thin liquids. Will try to follow up x1 while inpatient to see if more can be observed clinically, but would leave her diet unrestricted to allow for more options to hopefully find food items that she will want to eat. SLP Visit Diagnosis: Dysphagia, unspecified (R13.10)    Aspiration Risk  Mild aspiration risk    Diet Recommendation Regular;Thin liquid   Liquid Administration via: Cup;Straw Medication Administration: Whole meds with liquid(may need to crush - RN said  she was trying to chew them) Supervision: Staff to assist with self feeding Compensations: Minimize environmental distractions;Slow rate;Small sips/bites Postural Changes: Seated upright at 90 degrees    Other  Recommendations Oral Care Recommendations: Oral care BID   Follow up Recommendations 24 hour supervision/assistance      Frequency and Duration min 1 x/week  1 week       Prognosis Prognosis for Safe Diet Advancement: Good      Swallow Study   General HPI: Pt is an 85 yo female who presented from SNF with increasing lethargy and SOB after being diagnosed with COVID-19 three weeks prior. PMH includes arthritis, HLD, dry mouth, depression, breast cancer, anxiety, and dementia. Type of Study: Bedside Swallow Evaluation Previous Swallow Assessment: none in chart Diet Prior to this Study: Regular;Thin liquids Temperature Spikes Noted: No Respiratory Status: Nasal cannula History of Recent Intubation: No Behavior/Cognition: Alert;Cooperative;Confused Oral Cavity Assessment: Within Functional Limits Oral Care Completed by SLP: No Oral Cavity - Dentition: Adequate natural dentition Self-Feeding Abilities: Total assist Patient Positioning: Upright in bed Baseline Vocal Quality: Normal    Oral/Motor/Sensory Function Overall Oral Motor/Sensory Function: Within functional limits   Ice Chips Ice chips: Not tested   Thin Liquid Thin Liquid: Impaired Presentation: Straw Pharyngeal  Phase Impairments: Cough - Immediate(x1)    Nectar Thick Nectar Thick Liquid: Not tested   Honey Thick Honey Thick Liquid: Not tested   Puree Puree: Impaired Presentation: Spoon Oral Phase Functional Implications: Other (comment)(spits out food)   Solid     Solid: Impaired Presentation: Spoon Oral  Phase Functional Implications: Other (comment)(spits out food)      Venita Sheffield [place] [facility]o 05/05/2019,11:12 AM  Pollyann Glen, M.A. [place] Acute Environmental education officer [phone] Office  [phone]

## 2019-05-05 NOTE — Progress Notes (Signed)
Patient rested well. Pleasant but confused. Forde Dandy is disconnected. Right arm restrictions and pink band on right wrist. Patient admitted with two IV sites started outside of facility in right arm. This nurse looked for other IV sites in the left arm, but patient refused. Both IV sites infuse and or flush with no difficulty. Purewick in place with good UOP. Bowel movement x1 this shift.

## 2019-05-05 NOTE — Progress Notes (Signed)
Patients husband called and was able to talk to patient. This RN answered his questions.

## 2019-05-05 NOTE — Evaluation (Signed)
Physical Therapy Evaluation Patient Details Name: Kelsey Morrison MRN: [id number] DOB: [birth year] Today's Date: 05/05/2019   History of Present Illness  Patient is a 85 y.o. female with PMHx of dementia, HTN-who was diagnosed with COVID-19 approximately 3 weeks back-presented 05/04/19 from SNF  with worsening lethargy, shortness of breath-she was found to have COVID-19 viral pneumonia, hypernatremia and hypokalemia.    Clinical Impression  The patient requires iotal assist with mobility. Sat on bed edge with total assist for balance. Patient required  Linen change post incontinence. Patient appears to be near baseline with total assist. Will provide trial of PT. Patient from SNF. Pt admitted with above diagnosis. Pt currently with functional limitations due to the deficits listed below (see PT Problem List).  Pt will benefit from skilled PT to increase their independence and safety with mobility to allow discharge to the venue listed below.       Follow Up Recommendations No PT follow up(to return to LTC at facility)    Equipment Recommendations  None recommended by PT    Recommendations for Other Services       Precautions / Restrictions Precautions Precautions: Fall Precaution Comments: incontinece, painful LUE with limited shoulder  motion      Mobility  Bed Mobility Overal bed mobility: Needs Assistance Bed Mobility: Rolling;Supine to Sit;Sit to Supine Rolling: Total assist;+2 for physical assistance;+2 for safety/equipment   Supine to sit: Total assist;+2 for physical assistance;+2 for safety/equipment Sit to supine: Total assist;+2 for physical assistance;+2 for safety/equipment   General bed mobility comments: requires extensive assistance, at times moved legs, did place  legs almost onto the bed.  Transfers                 General transfer comment: NT  Ambulation/Gait                Stairs            Wheelchair Mobility    Modified Rankin  (Stroke Patients Only)       Balance Overall balance assessment: Needs assistance Sitting-balance support: No upper extremity supported;Feet supported Sitting balance-Leahy Scale: Zero Sitting balance - Comments: requires assist for balance, no responses                                     Pertinent Vitals/Pain Pain Assessment: Faces Faces Pain Scale: Hurts whole lot Pain Location: L shoulder when lying on it, right  elbow where Iv infusing Pain Descriptors / Indicators: Burning;Grimacing;Guarding;Moaning Pain Intervention(s): Limited activity within patient's tolerance;Monitored during session    Home Living Family/patient expects to be discharged to:: Unsure                 Additional Comments: from [facility], appears since 1/20    Prior Function Level of Independence: Needs assistance               Hand Dominance        Extremity/Trunk Assessment   Upper Extremity Assessment Upper Extremity Assessment: LUE deficits/detail LUE Deficits / Details: arm tight against the trunk    Lower Extremity Assessment Lower Extremity Assessment: RLE deficits/detail;LLE deficits/detail RLE Deficits / Details: holds legs ver stiff, did flex at hip and knee when sitting but not normmal LLE Deficits / Details: same as right    Cervical / Trunk Assessment Cervical / Trunk Assessment: Other exceptions  Communication   Communication: Expressive difficulties  Cognition  Arousal/Alertness: Awake/alert Behavior During Therapy: Anxious Overall Cognitive Status: History of cognitive impairments - at baseline                                 General Comments: expressive issues,       General Comments      Exercises     Assessment/Plan    PT Assessment Patient needs continued PT services(trail)  PT Problem List Decreased activity tolerance;Decreased balance;Decreased mobility;Decreased knowledge of precautions;Decreased safety  awareness;Decreased knowledge of use of DME;Decreased strength       PT Treatment Interventions Functional mobility training;Therapeutic activities;Patient/family education;Cognitive remediation    PT Goals (Current goals can be found in the Care Plan section)  Acute Rehab PT Goals PT Goal Formulation: Patient unable to participate in goal setting Time For Goal Achievement: 05/19/19 Potential to Achieve Goals: Fair    Frequency Min 2X/week   Barriers to discharge        Co-evaluation               AM-PAC PT "6 Clicks" Mobility  Outcome Measure Help needed turning from your back to your side while in a flat bed without using bedrails?: Total Help needed moving from lying on your back to sitting on the side of a flat bed without using bedrails?: Total Help needed moving to and from a bed to a chair (including a wheelchair)?: Total Help needed standing up from a chair using your arms (e.g., wheelchair or bedside chair)?: Total Help needed to walk in hospital room?: Total Help needed climbing 3-5 steps with a railing? : Total 6 Click Score: 6    End of Session   Activity Tolerance: Patient limited by pain Patient left: in bed;with nursing/sitter in room;with call bell/phone within reach;with bed alarm set Nurse Communication: Mobility status;Need for lift equipment PT Visit Diagnosis: Muscle weakness (generalized) (M62.81)    Time: 8850-2774 PT Time Calculation (min) (ACUTE ONLY): 32 min   Charges:   PT Evaluation $PT Eval Moderate Complexity: 1 Mod PT Treatments $Therapeutic Activity: 8-22 mins        Tresa Endo PT Acute Rehabilitation Services Pager [phone] Office [phone]   Kelsey Morrison 05/05/2019, 3:02 PM

## 2019-05-06 LAB — CBC WITH DIFFERENTIAL/PLATELET
Abs Immature Granulocytes: 0.19 10*3/uL — ABNORMAL HIGH (ref 0.00–0.07)
Basophils Absolute: 0.1 10*3/uL (ref 0.0–0.1)
Basophils Relative: 1 %
Eosinophils Absolute: 0.3 10*3/uL (ref 0.0–0.5)
Eosinophils Relative: 3 %
HCT: 34 % — ABNORMAL LOW (ref 36.0–46.0)
Hemoglobin: 9.9 g/dL — ABNORMAL LOW (ref 12.0–15.0)
Immature Granulocytes: 2 %
Lymphocytes Relative: 25 %
Lymphs Abs: 2.5 10*3/uL (ref 0.7–4.0)
MCH: 25.8 pg — ABNORMAL LOW (ref 26.0–34.0)
MCHC: 29.1 g/dL — ABNORMAL LOW (ref 30.0–36.0)
MCV: 88.8 fL (ref 80.0–100.0)
Monocytes Absolute: 0.4 10*3/uL (ref 0.1–1.0)
Monocytes Relative: 4 %
Neutro Abs: 6.6 10*3/uL (ref 1.7–7.7)
Neutrophils Relative %: 65 %
Platelets: 344 10*3/uL (ref 150–400)
RBC: 3.83 MIL/uL — ABNORMAL LOW (ref 3.87–5.11)
RDW: 15.9 % — ABNORMAL HIGH (ref 11.5–15.5)
WBC: 9.9 10*3/uL (ref 4.0–10.5)
nRBC: 0 % (ref 0.0–0.2)

## 2019-05-06 LAB — COMPREHENSIVE METABOLIC PANEL
ALT: 15 U/L (ref 0–44)
AST: 33 U/L (ref 15–41)
Albumin: 2.1 g/dL — ABNORMAL LOW (ref 3.5–5.0)
Alkaline Phosphatase: 99 U/L (ref 38–126)
Anion gap: 8 (ref 5–15)
BUN: 15 mg/dL (ref 8–23)
CO2: 21 mmol/L — ABNORMAL LOW (ref 22–32)
Calcium: 7.9 mg/dL — ABNORMAL LOW (ref 8.9–10.3)
Chloride: 116 mmol/L — ABNORMAL HIGH (ref 98–111)
Creatinine, Ser: 0.62 mg/dL (ref 0.44–1.00)
GFR calc Af Amer: >60 mL/min (ref >60)
GFR calc non Af Amer: >60 mL/min (ref >60)
Glucose, Bld: 61 mg/dL — ABNORMAL LOW (ref 70–99)
Potassium: 4.4 mmol/L (ref 3.5–5.1)
Sodium: 145 mmol/L (ref 135–145)
Total Bilirubin: 0.1 mg/dL — ABNORMAL LOW (ref 0.3–1.2)
Total Protein: 6.1 g/dL — ABNORMAL LOW (ref 6.5–8.1)

## 2019-05-06 LAB — GLUCOSE, CAPILLARY
Glucose-Capillary: 100 mg/dL — ABNORMAL HIGH (ref 70–99)
Glucose-Capillary: 78 mg/dL (ref 70–99)

## 2019-05-06 LAB — FERRITIN: Ferritin: 486 ng/mL — ABNORMAL HIGH (ref 11–307)

## 2019-05-06 LAB — C-REACTIVE PROTEIN: CRP: 15.2 mg/dL — ABNORMAL HIGH (ref <1.0)

## 2019-05-06 MED ORDER — POTASSIUM CHLORIDE 20 MEQ PO PACK
40.0000 meq | PACK | ORAL | Status: AC
  Administered 2019-05-06 (×2): 40 meq via ORAL
  Filled 2019-05-06 (×5): qty 2

## 2019-05-06 MED ORDER — POTASSIUM CHLORIDE 20 MEQ PO PACK
40.0000 meq | PACK | ORAL | Status: DC
  Filled 2019-05-06 (×3): qty 2

## 2019-05-06 NOTE — Progress Notes (Signed)
PROGRESS NOTE                                                                                                                                                                                                             Patient Demographics:    Kelsey Morrison, is a 85 y.o. female, DOB - 06-01-1934, PYK:[id number]  Outpatient Primary MD for the patient is Maryland Pink, MD    LOS - 2  No chief complaint on file.      Brief Narrative: Patient is a 85 y.o. female with PMHx of dementia, HTN-who was diagnosed with COVID-19 approximately 3 weeks back-presented with worsening lethargy, shortness of breath-she was found to have COVID-19 viral pneumonia, hypernatremia and hypokalemia.  She was subsequently admitted to the triad hospitalist service at [facility].   Subjective:   Lying comfortably in bed-not in any distress.  Remains pleasantly confused.   Assessment  & Plan :   Acute Hypoxic Resp Failure due to Covid 19 Viral pneumonia: Stable with very minimal oxygen requirements-surprisingly her CRP is disproportionately high to or her clinical situation-but now is downtrending.  Continue supportive care-she does not need any immunomodulator or antiretroviral agents given her stable clinical profile.  Furthermore she is almost 3 weeks out from her initial diagnosis.  In any event, not a candidate for any further escalation in care-if she deteriorates-appropriate to transition to full comfort measures.     COVID-19 Labs:  Recent Labs    05/05/19 0305 05/06/19 0415  FERRITIN 738* 486*  CRP 23.0* 15.2*    Lab Results  Component Value Date   SARSCOV2NAA POSITIVE (A) 05/04/2019     COVID-19 Medications: None  Hypernatremia: Resolved  Hypokalemia: Repleted  Hypertension: Controlled-continue with amlodipine  Dementia with delirium: At risk for delirium-remains very pleasantly confused-continue with Namenda, olanzapine and  Remeron  Debility/deconditioning: Suspect debilitated at baseline-probably worsened due to dehydration and COVID-19 viral pneumonia.  Avoid physical therapy and SLP evaluation.  Palliative care: DNR in place-given her overall frailty and poor overall health-she is not a candidate for aggressive care.  If she were to deteriorate-appropriate to transition to comfort measures after discussion with family.    ABG: No results found for: PHART, PCO2ART, PO2ART, HCO3, TCO2, ACIDBASEDEF, O2SAT  Condition - Extremely Guarded  Family Communication  :  Unable to reach spouse on the phone-left  voicemail for spouse.   Code Status :  DNR  Diet : Heart Healthy diet  Disposition Plan  :  Remain inpatient  Consults  :  PCCM  Procedures  :  None  DVT Prophylaxis  :  Lovenox  Lab Results  Component Value Date   PLT 344 05/06/2019    Inpatient Medications  Scheduled Meds: . amLODipine  5 mg Oral Daily  . aspirin EC  81 mg Oral QHS  . enoxaparin (LOVENOX) injection  40 mg Subcutaneous Q24H  . memantine  10 mg Oral BID  . mirtazapine  7.5 mg Oral QHS  . OLANZapine  2.5 mg Oral Daily  . potassium chloride  40 mEq Oral Q3H  . potassium chloride  40 mEq Oral Once  . sodium chloride flush  3 mL Intravenous Q12H   Continuous Infusions: . dextrose 5 % 1,000 mL with potassium chloride 40 mEq infusion 75 mL/hr at 05/05/19 0946   PRN Meds:.acetaminophen, chlorpheniramine-HYDROcodone, guaiFENesin-dextromethorphan, ondansetron **OR** ondansetron (ZOFRAN) IV, traZODone  Antibiotics  :    Anti-infectives (From admission, onward)   None       Time Spent in minutes 25   Oren Binet M.D on 05/06/2019 at 12:48 PM  To page go to [email or web address] - use universal password  [facility] -  Office  [phone]    Admit date - 05/04/2019    2    Objective:   Vitals:   05/06/19 0000 05/06/19 0400 05/06/19 0502 05/06/19 0800  BP: 128/78 133/64 133/64 122/68  Pulse: (!) 101 (!) 102  96 94  Resp: (!) 8 19 13 17   Temp:   99.2 F (37.3 C) 98.7 F (37.1 C)  TempSrc:   Axillary Axillary  SpO2: 97% 98% 98% 100%  Weight:      Height:        Wt Readings from Last 3 Encounters:  05/04/19 48.8 kg  12/21/18 65.8 kg  11/20/18 65 kg     Intake/Output Summary (Last 24 hours) at 05/06/2019 1248 Last data filed at 05/06/2019 0300 Gross per 24 hour  Intake 1300.52 ml  Output -  Net 1300.52 ml     Physical Exam Gen Exam: Not in any distress-pleasantly confused.     HEENT: Atraumatic, normocephalic Chest: Clear to auscultation CVS: S1-S2 regular Abdomen: Soft, nontender nondistended Extremities:no edema Neurology: Nonfocal-but given dementia-difficult exam  Skin: no rash   Data Review:    CBC Recent Labs  Lab 05/04/19 0952 05/05/19 0305 05/06/19 0409  WBC 10.6* 8.8 9.9  HGB 10.9* 8.9* 9.9*  HCT 38.0 31.2* 34.0*  PLT 415* 322 344  MCV 88.2 89.4 88.8  MCH 25.3* 25.5* 25.8*  MCHC 28.7* 28.5* 29.1*  RDW 16.0* 15.9* 15.9*  LYMPHSABS 2.1 1.9 2.5  MONOABS 0.4 0.3 0.4  EOSABS 0.1 0.3 0.3  BASOSABS 0.1 0.0 0.1    Chemistries  Recent Labs  Lab 05/04/19 0952 05/05/19 0305 05/06/19 0409  NA 155* 152* 145  K 2.9* 2.5* 4.4  CL 119* 121* 116*  CO2 25 25 21*  GLUCOSE 90 82 61*  BUN 35* 28* 15  CREATININE 0.81 0.73 0.62  CALCIUM 8.2* 7.9* 7.9*  AST 27 22 33  ALT 16 16 15   ALKPHOS 117 86 99  BILITOT 0.6 0.4 0.1*   ------------------------------------------------------------------------------------------------------------------ No results for input(s): CHOL, HDL, LDLCALC, TRIG, CHOLHDL, LDLDIRECT in the last 72 hours.  No results found for: HGBA1C ------------------------------------------------------------------------------------------------------------------ No results for input(s): TSH, T4TOTAL, T3FREE, THYROIDAB in the last 72 hours.  Invalid input(s): FREET3  ------------------------------------------------------------------------------------------------------------------ Recent Labs    05/05/19 0305 05/06/19 0415  FERRITIN 738* 486*    Coagulation profile Recent Labs  Lab 05/04/19 0952  INR 1.2    No results for input(s): DDIMER in the last 72 hours.  Cardiac Enzymes No results for input(s): CKMB, TROPONINI, MYOGLOBIN in the last 168 hours.  Invalid input(s): CK ------------------------------------------------------------------------------------------------------------------    Component Value Date/Time   BNP 113.0 (H) 05/24/2017 1648    Micro Results Recent Results (from the past 240 hour(s))  Urine Culture     Status: None   Collection Time: 05/02/19  6:30 PM  Result Value Ref Range Status   Specimen Description   Final    URINE, RANDOM Performed at [facility], [address] [place], [place] [address]    Special Requests   Final    NONE Performed at [facility], [address] [place], [place] [address]    Culture   Final    NO GROWTH Performed at [facility], [place] [address] [place], [place] [address]    Report Status 05/03/2019 FINAL  Final  Blood Culture (routine x 2)     Status: None (Preliminary result)   Collection Time: 05/04/19  9:52 AM  Result Value Ref Range Status   Specimen Description BLOOD RIGHT ANTECUBITAL  Final   Special Requests   Final    BOTTLES DRAWN AEROBIC AND ANAEROBIC Blood Culture adequate volume   Culture   Final    NO GROWTH 2 DAYS Performed at [facility], [address] [place], [place] [address]    Report Status PENDING  Incomplete  Blood Culture (routine x 2)     Status: None (Preliminary result)   Collection Time: 05/04/19  9:52 AM  Result Value Ref Range Status   Specimen Description BLOOD LEFT HAND  Final   Special Requests   Final    BOTTLES DRAWN AEROBIC AND ANAEROBIC Blood Culture adequate volume   Culture   Final    NO  GROWTH 2 DAYS Performed at [facility], [address] [place], [place] [address]    Report Status PENDING  Incomplete  SARS Coronavirus 2 (CEPHEID- Performed in [place] hospital lab), Hosp Order     Status: Abnormal   Collection Time: 05/04/19  9:52 AM  Result Value Ref Range Status   SARS Coronavirus 2 POSITIVE (A) NEGATIVE Final    Comment: RESULT CALLED TO, READ BACK BY AND VERIFIED WITH: Wallie Char RN AT 5400 ON 05/04/19 SG (NOTE) If result is NEGATIVE SARS-CoV-2 target nucleic acids are NOT DETECTED. The SARS-CoV-2 RNA is generally detectable in upper and lower  respiratory specimens during the acute phase of infection. The lowest  concentration of SARS-CoV-2 viral copies this assay can detect is 250  copies / mL. A negative result does not preclude SARS-CoV-2 infection  and should not be used as the sole basis for treatment or other  patient management decisions.  A negative result may occur with  improper specimen collection / handling, submission of specimen other  than nasopharyngeal swab, presence of viral mutation(s) within the  areas targeted by this assay, and inadequate number of viral copies  (<250 copies / mL). A negative result must be combined with clinical  observations, patient history, and epidemiological information. If result is POSITIVE SARS-CoV-2 target nucleic acids are DETECTED.  The SARS-CoV-2 RNA is generally detectable in upper and lower  respiratory specimens during the acute phase of  infection.  Positive  results are indicative of active infection with SARS-CoV-2.  Clinical  correlation with patient history and other diagnostic information is  necessary to determine patient infection status.  Positive results do  not rule out bacterial infection or co-infection with other viruses. If result is PRESUMPTIVE POSTIVE SARS-CoV-2 nucleic acids MAY BE PRESENT.   A presumptive positive result was obtained on the submitted specimen  and  confirmed on repeat testing.  While 2019 novel coronavirus  (SARS-CoV-2) nucleic acids may be present in the submitted sample  additional confirmatory testing may be necessary for epidemiological  and / or clinical management purposes  to differentiate between  SARS-CoV-2 and other Sarbecovirus currently known to infect humans.  If clinically indicated additional testing with an alternate test  methodology [phone])  is advised. The SARS-CoV-2 RNA is generally  detectable in upper and lower respiratory specimens during the acute  phase of infection. The expected result is Negative. Fact Sheet for Patients:  StrictlyIdeas.no Fact Sheet for Healthcare Providers: BankingDealers.co.za This test is not yet approved or cleared by the [place] FDA and has been authorized for detection and/or diagnosis of SARS-CoV-2 by FDA under an Emergency Use Authorization (EUA).  This EUA will remain in effect (meaning this test can be used) for the duration of the COVID-19 declaration under Section 564(b)(1) of the Act, 21 U.S.C. section 360bbb-3(b)(1), unless the authorization is terminated or revoked sooner. Performed at [facility], [address], [place], [place] [address]     Radiology Reports Dg Chest [place] 1 View  Result Date: 05/04/2019 CLINICAL DATA:  Shortness of breath and poor oxygen saturation. History of coronavirus infection. EXAM: PORTABLE CHEST 1 VIEW COMPARISON:  12/21/2018 FINDINGS: Artifact overlies the chest. Heart size remains normal. There is atherosclerosis of the aorta. There are newly seen widespread patchy pulmonary infiltrates without dense consolidation. The findings could be consistent with viral or bacterial pneumonia. No visible effusion. Chronic degenerative changes of the left shoulder. No acute bone finding. IMPRESSION: Newly seen widespread patchy pulmonary infiltrates which could be consistent with viral or  bacterial pneumonia. No dense consolidation, lobar collapse or visible pleural effusion. Given the history of coronavirus infection, findings are consistent with that diagnosis. Electronically Signed   By: Nelson Chimes M.D.   On: 05/04/2019 10:54

## 2019-05-06 NOTE — TOC Initial Note (Signed)
[facility]) - Initial/Assessment Note    Patient Details  Name: Kelsey Morrison MRN: [id number] Date of Birth: 1934/10/31  Transition [facility]) CM/SW Contact:    Alberteen Sam, [place] Phone Number: [phone] 05/06/2019, 10:34 AM  Clinical Narrative:                  CSW consulted with patient's spouse Bennie regarding discharge planning. Rogers Seeds confirms patient is from [facility] and plan is for her to return there at discharge. Bennie asked CSW discharge time frame, CSW informed Bennie that according to MD if patient continues to be medically stable plan for possible discharge tomorrow 5/29 back to [facility]. Rogers Seeds is in agreement and reports no questions or concerns at this time.    Expected Discharge Plan: Skilled Nursing Facility Barriers to Discharge: Continued Medical Work up   Patient Goals and CMS Choice   CMS Medicare.gov Compare Post Acute Care list provided to:: Patient Represenative (must comment)(Bennie (spouse)) Choice offered to / list presented to : Spouse(Bennie)  Expected Discharge Plan and Services Expected Discharge Plan: [place] Choice: [place] Living arrangements for the past 2 months: Skilled Nursing Facility([facility])                                      Prior Living Arrangements/Services Living arrangements for the past 2 months: Skilled Nursing Facility([facility]) Lives with:: Self Patient language and need for interpreter reviewed:: Yes Do you feel safe going back to the place where you live?: Yes      Need for Family Participation in Patient Care: Yes (Comment) Care giver support system in place?: Yes (comment)   Criminal Activity/Legal Involvement Pertinent to Current Situation/Hospitalization: No - Comment as needed  Activities of Daily Living      Permission Sought/Granted Permission sought to share information with : Case Manager, Forensic psychologist, Family Supports Permission granted to share information with : Yes, Verbal Permission Granted  Share Information with NAME: Bennie  Permission granted to share info w AGENCY: SNFs  Permission granted to share info w Relationship: spouse  Permission granted to share info w Contact Information: [phone]  Emotional Assessment Appearance:: Other (Comment Required(unable to assess - remote) Attitude/Demeanor/Rapport: Unable to Assess Affect (typically observed): Unable to Assess Orientation: : Oriented to Self Alcohol / Substance Use: Not Applicable Psych Involvement: No (comment)  Admission diagnosis:  COVID-19 VIRUS INFECTION Patient Active Problem List   Diagnosis Date Noted  . COVID-19 virus infection 05/04/2019  . Acute respiratory failure with hypoxia ([place]) 05/04/2019  . Orthostatic hypotension 12/23/2018  . A-fib ([place]) 12/21/2018  . Sepsis ([place]) 05/18/2017  . Syncope and collapse 07/25/2015  . HTN (hypertension) 07/25/2015  . Anxiety 07/25/2015  . Alzheimer's dementia ([place]) 07/25/2015  . Inflammatory arthritis 07/25/2015  . Dry mouth 07/25/2015   PCP:  Maryland Pink, MD Pharmacy:   [facility] [phone] [address], [place] [address] Phone: [phone] Fax: [phone]     Social Determinants of Health (SDOH) Interventions    Readmission Risk Interventions No flowsheet data found.

## 2019-05-06 NOTE — NC FL2 (Signed)
[place] LEVEL OF CARE SCREENING TOOL     IDENTIFICATION  Patient Name: Kelsey Morrison Birthdate: 05-23-1934 Sex: female Admission Date (Current Location): 05/04/2019  [facility] and [place] Number:  Engineering geologist and Address:  [facility] [place], [place] [address], [place], [place] [address]([place])      Provider Number: [id number]  Attending Physician Name and Address:  Jonetta Osgood, MD  Relative Name and Phone Number:  Rogers Seeds (spouse) [phone]    Current Level of Care: Hospital Recommended Level of Care: [place] Prior Approval Number:    Date Approved/Denied: 02/15/14 PASRR Number: [id number]  Discharge Plan: SNF    Current Diagnoses: Patient Active Problem List   Diagnosis Date Noted  . COVID-19 virus infection 05/04/2019  . Acute respiratory failure with hypoxia ([place]) 05/04/2019  . Orthostatic hypotension 12/23/2018  . A-fib ([place]) 12/21/2018  . Sepsis ([place]) 05/18/2017  . Syncope and collapse 07/25/2015  . HTN (hypertension) 07/25/2015  . Anxiety 07/25/2015  . Alzheimer's dementia ([place]) 07/25/2015  . Inflammatory arthritis 07/25/2015  . Dry mouth 07/25/2015    Orientation RESPIRATION BLADDER Height & Weight     Self  O2(nasal cannula 3L/min) Incontinent, External catheter Weight: 107 lb 9.4 oz (48.8 kg) Height:  5\' 2"  (157.5 cm)  BEHAVIORAL SYMPTOMS/MOOD NEUROLOGICAL BOWEL NUTRITION STATUS      Incontinent Diet(see discharge summary)  AMBULATORY STATUS COMMUNICATION OF NEEDS Skin   Extensive Assist(maxislide used) Verbally PU Stage and Appropriate Care, Other (Comment)(ecchymosis right and left hands, MASD groin and sacrum, pressure injury stage 1 sacrum)                       Personal Care Assistance Level of Assistance  Bathing, Feeding, Dressing, Total care Bathing Assistance: Maximum assistance Feeding assistance: Maximum assistance Dressing Assistance: Maximum  assistance Total Care Assistance: Maximum assistance   Functional Limitations Info  Sight, Hearing, Speech Sight Info: Adequate Hearing Info: Adequate Speech Info: Adequate    SPECIAL CARE FACTORS FREQUENCY  PT (By licensed PT), OT (By licensed OT)     PT Frequency: min 5x weekly OT Frequency: min 5x weekly            Contractures Contractures Info: Not present    Additional Factors Info  Code Status, Allergies, Isolation Precautions Code Status Info: DNR Allergies Info: Celecoxib, Hydrocodone     Isolation Precautions Info: emerging pathogen, air and contact precautions     Current Medications (05/06/2019):  This is the current hospital active medication list Current Facility-Administered Medications  Medication Dose Route Frequency Provider Last Rate Last Dose  . acetaminophen (TYLENOL) tablet 650 mg  650 mg Oral Q6H PRN Jonetta Osgood, MD      . amLODipine (NORVASC) tablet 5 mg  5 mg Oral Daily Jonetta Osgood, MD   5 mg at 05/06/19 0924  . aspirin EC tablet 81 mg  81 mg Oral QHS Jonetta Osgood, MD   81 mg at 05/05/19 2116  . chlorpheniramine-HYDROcodone (TUSSIONEX) 10-8 MG/5ML suspension 5 mL  5 mL Oral Q12H PRN Jonetta Osgood, MD   5 mL at 05/05/19 2116  . dextrose 5 % 1,000 mL with potassium chloride 40 mEq infusion   Intravenous Continuous Jonetta Osgood, MD 75 mL/hr at 05/05/19 0946    . enoxaparin (LOVENOX) injection 40 mg  40 mg Subcutaneous Q24H Jonetta Osgood, MD   40 mg at 05/05/19 2116  . guaiFENesin-dextromethorphan (ROBITUSSIN DM)  100-10 MG/5ML syrup 10 mL  10 mL Oral Q4H PRN Jonetta Osgood, MD      . memantine [facility]) tablet 10 mg  10 mg Oral BID Jonetta Osgood, MD   10 mg at 05/06/19 0924  . mirtazapine (REMERON) tablet 7.5 mg  7.5 mg Oral QHS Jonetta Osgood, MD   7.5 mg at 05/05/19 2116  . OLANZapine (ZYPREXA) tablet 2.5 mg  2.5 mg Oral Daily Jonetta Osgood, MD   2.5 mg at 05/06/19 0924  . ondansetron (ZOFRAN)  tablet 4 mg  4 mg Oral Q6H PRN Ghimire, Henreitta Leber, MD       Or  . ondansetron (ZOFRAN) injection 4 mg  4 mg Intravenous Q6H PRN Ghimire, Henreitta Leber, MD      . potassium chloride (KLOR-CON) packet 40 mEq  40 mEq Oral Q3H Annita Brod, MD   40 mEq at 05/06/19 0710  . potassium chloride SA (K-DUR) CR tablet 40 mEq  40 mEq Oral Once Jonetta Osgood, MD      . sodium chloride flush (NS) 0.9 % injection 3 mL  3 mL Intravenous Q12H Jonetta Osgood, MD   3 mL at 05/06/19 0925  . traZODone (DESYREL) tablet 25 mg  25 mg Oral QHS PRN Ghimire, Henreitta Leber, MD         Discharge Medications: Please see discharge summary for a list of discharge medications.  Relevant Imaging Results:  Relevant Lab Results:   Additional Information SSN: [id number]  Alberteen Sam, LCSW

## 2019-05-06 NOTE — Progress Notes (Signed)
  Speech Language Pathology Treatment: Dysphagia  Patient Details Name: Kelsey Morrison MRN: [id number] DOB: Aug 23, 1934 Today's Date: 05/06/2019 Time: 6282-4175 SLP Time Calculation (min) (ACUTE ONLY): 10 min  Assessment / Plan / Recommendation Clinical Impression  Pt appears to be at baseline with regard to swallow function.  She declined regular solids today, was willing to eat pudding and drink water with adequate oral anticipation/manipulation, brisk swallow response, adequate airway protection with no overt s/s of aspiration.  Intake fluctuates - continue regular tray to allow for more food choices. No SLP f/u is needed  - our service will sign off.  Pt for likely D/C in the next day or so.    HPI HPI: Pt is an 85 yo female who presented from SNF with increasing lethargy and SOB after being diagnosed with COVID-19 three weeks prior. PMH includes arthritis, HLD, dry mouth, depression, breast cancer, anxiety, and dementia.      SLP Plan  All goals met       Recommendations  Diet recommendations: Regular;Thin liquid Liquids provided via: Straw;Cup Medication Administration: Whole meds with liquid Supervision: Staff to assist with self feeding Compensations: Minimize environmental distractions;Slow rate;Small sips/bites Postural Changes and/or Swallow Maneuvers: Seated upright 90 degrees                Oral Care Recommendations: Oral care BID Follow up Recommendations: 24 hour supervision/assistance SLP Visit Diagnosis: Dysphagia, unspecified (R13.10) Plan: All goals met       GO                Kelsey Morrison 05/06/2019, 11:18 AM  Kelsey Morrison, Kelsey Morrison Office number [phone] Pager [phone]

## 2019-05-07 NOTE — Plan of Care (Signed)
  Problem: Coping: Goal: Psychosocial and spiritual needs will be supported Outcome: Progressing   Problem: Respiratory: Goal: Will maintain a patent airway Outcome: Progressing Goal: Complications related to the disease process, condition or treatment will be avoided or minimized Outcome: Progressing   Problem: Clinical Measurements: Goal: Ability to maintain clinical measurements within normal limits will improve Outcome: Progressing Goal: Diagnostic test results will improve Outcome: Progressing Goal: Respiratory complications will improve Outcome: Progressing Goal: Cardiovascular complication will be avoided Outcome: Progressing   Problem: Coping: Goal: Level of anxiety will decrease Outcome: Progressing   Problem: Elimination: Goal: Will not experience complications related to bowel motility Outcome: Progressing Goal: Will not experience complications related to urinary retention Outcome: Progressing   Problem: Pain Managment: Goal: General experience of comfort will improve Outcome: Progressing   Problem: Safety: Goal: Ability to remain free from injury will improve Outcome: Progressing   Problem: Skin Integrity: Goal: Risk for impaired skin integrity will decrease Outcome: Progressing   Problem: Education: Goal: Knowledge of risk factors and measures for prevention of condition will improve Outcome: Not Progressing Note:  Due to cognitive function   Problem: Education: Goal: Knowledge of General Education information will improve Description Including pain rating scale, medication(s)/side effects and non-pharmacologic comfort measures Outcome: Not Progressing Note:  Due to cognitive function   Problem: Health Behavior/Discharge Planning: Goal: Ability to manage health-related needs will improve Outcome: Not Progressing   Problem: Activity: Goal: Risk for activity intolerance will decrease Outcome: Not Progressing Note:  Due to physical/mental  abilities    Problem: Nutrition: Goal: Adequate nutrition will be maintained Outcome: Not Progressing Note:  Poor appetite

## 2019-05-07 NOTE — Progress Notes (Signed)
Spoke with RN if patient still needs PIV and stated she might be discharging today but if not to placed another IV consult. Will follow up.

## 2019-05-07 NOTE — Plan of Care (Signed)
Problem: Education: Goal: Knowledge of risk factors and measures for prevention of condition will improve 05/07/2019 1238 by Phyllis Ginger, RN Outcome: Adequate for Discharge 05/07/2019 0912 by Phyllis Ginger, RN Outcome: Not Progressing Note:  Due to cognitive function   Problem: Coping: Goal: Psychosocial and spiritual needs will be supported 05/07/2019 1238 by Phyllis Ginger, RN Outcome: Adequate for Discharge 05/07/2019 0912 by Phyllis Ginger, RN Outcome: Progressing   Problem: Respiratory: Goal: Will maintain a patent airway 05/07/2019 1238 by Phyllis Ginger, RN Outcome: Adequate for Discharge 05/07/2019 0912 by Phyllis Ginger, RN Outcome: Progressing Goal: Complications related to the disease process, condition or treatment will be avoided or minimized 05/07/2019 1238 by Phyllis Ginger, RN Outcome: Adequate for Discharge 05/07/2019 0912 by Phyllis Ginger, RN Outcome: Progressing   Problem: Education: Goal: Knowledge of General Education information will improve Description Including pain rating scale, medication(s)/side effects and non-pharmacologic comfort measures 05/07/2019 1238 by Phyllis Ginger, RN Outcome: Adequate for Discharge 05/07/2019 0912 by Phyllis Ginger, RN Outcome: Not Progressing Note:  Due to cognitive function   Problem: Health Behavior/Discharge Planning: Goal: Ability to manage health-related needs will improve 05/07/2019 1238 by Phyllis Ginger, RN Outcome: Adequate for Discharge 05/07/2019 0912 by Phyllis Ginger, RN Outcome: Not Progressing   Problem: Clinical Measurements: Goal: Ability to maintain clinical measurements within normal limits will improve 05/07/2019 1238 by Phyllis Ginger, RN Outcome: Adequate for Discharge 05/07/2019 0912 by Phyllis Ginger, RN Outcome: Progressing Goal: Will remain free from infection Outcome: Adequate for Discharge Goal: Diagnostic test results will improve 05/07/2019 1238 by Phyllis Ginger,  RN Outcome: Adequate for Discharge 05/07/2019 0912 by Phyllis Ginger, RN Outcome: Progressing Goal: Respiratory complications will improve 05/07/2019 1238 by Phyllis Ginger, RN Outcome: Adequate for Discharge 05/07/2019 0912 by Phyllis Ginger, RN Outcome: Progressing Goal: Cardiovascular complication will be avoided 05/07/2019 1238 by Phyllis Ginger, RN Outcome: Adequate for Discharge 05/07/2019 0912 by Phyllis Ginger, RN Outcome: Progressing   Problem: Activity: Goal: Risk for activity intolerance will decrease 05/07/2019 1238 by Phyllis Ginger, RN Outcome: Adequate for Discharge 05/07/2019 0912 by Phyllis Ginger, RN Outcome: Not Progressing Note:  Due to physical/mental abilities    Problem: Nutrition: Goal: Adequate nutrition will be maintained 05/07/2019 1238 by Phyllis Ginger, RN Outcome: Adequate for Discharge 05/07/2019 0912 by Phyllis Ginger, RN Outcome: Not Progressing Note:  Poor appetite   Problem: Coping: Goal: Level of anxiety will decrease 05/07/2019 1238 by Phyllis Ginger, RN Outcome: Adequate for Discharge 05/07/2019 0912 by Phyllis Ginger, RN Outcome: Progressing   Problem: Elimination: Goal: Will not experience complications related to bowel motility 05/07/2019 1238 by Phyllis Ginger, RN Outcome: Adequate for Discharge 05/07/2019 0912 by Phyllis Ginger, RN Outcome: Progressing Goal: Will not experience complications related to urinary retention 05/07/2019 1238 by Phyllis Ginger, RN Outcome: Adequate for Discharge 05/07/2019 0912 by Phyllis Ginger, RN Outcome: Progressing   Problem: Pain Managment: Goal: General experience of comfort will improve 05/07/2019 1238 by Phyllis Ginger, RN Outcome: Adequate for Discharge 05/07/2019 0912 by Phyllis Ginger, RN Outcome: Progressing   Problem: Safety: Goal: Ability to remain free from injury will improve 05/07/2019 1238 by Phyllis Ginger, RN Outcome: Adequate for Discharge 05/07/2019 0912 by  Phyllis Ginger, RN Outcome: Progressing   Problem: Skin Integrity: Goal: Risk for impaired skin integrity will decrease 05/07/2019 1238 by Phyllis Ginger, RN  Outcome: Adequate for Discharge 05/07/2019 0912 by Phyllis Ginger, RN Outcome: Progressing

## 2019-05-07 NOTE — TOC Transition Note (Addendum)
Transition of Care [facility]) - CM/SW Discharge Note   Patient Details  Name: NEVAE PINNIX MRN: [id number] Date of Birth: 1934/04/11  [facility]) CM/SW Contact:  Alberteen Sam, [place] Phone Number: [phone] 05/07/2019, 11:00 AM   Clinical Narrative:     Patient will DC to: [facility] Anticipated DC date: 05/07/2019 Family notified: Bennie Transport MV:[id number]  Per MD patient ready for DC to [facility] . RN, patient, patient's family, and facility notified of DC. Discharge Summary sent to facility. RN given number for report [phone] Room 331A. DC packet on chart. Ambulance transport requested for patient.  CSW signing off.  [place], [place]   Final next level of care: Skilled Nursing Facility Barriers to Discharge: No Barriers Identified   Patient Goals and CMS Choice   CMS Medicare.gov Compare Post Acute Care list provided to:: Patient Represenative (must comment)(Bennie (spouse)) Choice offered to / list presented to : Spouse(Bennie)  Discharge Placement PASRR number recieved: 05/06/19            Patient chooses bed at: [facility] Patient to be transferred to facility by: [place] Name of family member notified: Bennie Patient and family notified of of transfer: 05/07/19  Discharge Plan and Services     Post Acute Care Choice: [place]                               Social Determinants of Health (SDOH) Interventions     Readmission Risk Interventions No flowsheet data found.

## 2019-05-07 NOTE — Progress Notes (Addendum)
0835  Resting in bed.  Confused.  O2 sat 100% on 3L/[place]  0900  O2/[place] removed per MD request at bedside.  O2 sat remains 98-100% on room air.  Assisted with breakfast.  Only a few bites and sips.  1120  Incontinent of small brown BM.  Pericare provided.  Pad changed.    1204  Report given to [place] at [facility].  EMS here to transport patient.

## 2019-05-07 NOTE — Plan of Care (Signed)

## 2019-05-07 NOTE — Discharge Instructions (Signed)
Person Under Monitoring Name: Kelsey Morrison  Location: [place] [address]   Infection Prevention Recommendations for Individuals Confirmed to have, or Being Evaluated for, 2019 Novel Coronavirus (COVID-19) Infection Who Receive Care at Home  Individuals who are confirmed to have, or are being evaluated for, COVID-19 should follow the prevention steps below until a healthcare provider or local or state health department says they can return to normal activities.  Stay home except to get medical care You should restrict activities outside your home, except for getting medical care. Do not go to work, school, or public areas, and do not use public transportation or taxis.  Call ahead before visiting your doctor Before your medical appointment, call the healthcare provider and tell them that you have, or are being evaluated for, COVID-19 infection. This will help the healthcare providers office take steps to keep other people from getting infected. Ask your healthcare provider to call the local or state health department.  Monitor your symptoms Seek prompt medical attention if your illness is worsening (e.g., difficulty breathing). Before going to your medical appointment, call the healthcare provider and tell them that you have, or are being evaluated for, COVID-19 infection. Ask your healthcare provider to call the local or state health department.  Wear a facemask You should wear a facemask that covers your nose and mouth when you are in the same room with other people and when you visit a healthcare provider. People who live with or visit you should also wear a facemask while they are in the same room with you.  Separate yourself from other people in your home As much as possible, you should stay in a different room from other people in your home. Also, you should use a separate bathroom, if available.  Avoid sharing household items You should not  share dishes, drinking glasses, cups, eating utensils, towels, bedding, or other items with other people in your home. After using these items, you should wash them thoroughly with soap and water.  Cover your coughs and sneezes Cover your mouth and nose with a tissue when you cough or sneeze, or you can cough or sneeze into your sleeve. Throw used tissues in a lined trash can, and immediately wash your hands with soap and water for at least 20 seconds or use an alcohol-based hand rub.  Wash your [facility] your hands often and thoroughly with soap and water for at least 20 seconds. You can use an alcohol-based hand sanitizer if soap and water are not available and if your hands are not visibly dirty. Avoid touching your eyes, nose, and mouth with unwashed hands.   Prevention Steps for Caregivers and Household Members of Individuals Confirmed to have, or Being Evaluated for, COVID-19 Infection Being Cared for in the Home  If you live with, or provide care at home for, a person confirmed to have, or being evaluated for, COVID-19 infection please follow these guidelines to prevent infection:  Follow healthcare providers instructions Make sure that you understand and can help the patient follow any healthcare provider instructions for all care.  Provide for the patients basic needs You should help the patient with basic needs in the home and provide support for getting groceries, prescriptions, and other personal needs.  Monitor the patients symptoms If they are getting sicker, call his or her medical provider and tell them that the patient has, or is being evaluated for, COVID-19 infection. This will help the healthcare providers office  take steps to keep other people from getting infected. Ask the healthcare provider to call the local or state health department.  Limit the number of people who have contact with the patient  If possible, have only one caregiver for the  patient.  Other household members should stay in another home or place of residence. If this is not possible, they should stay  in another room, or be separated from the patient as much as possible. Use a separate bathroom, if available.  Restrict visitors who do not have an essential need to be in the home.  Keep older adults, very young children, and other sick people away from the patient Keep older adults, very young children, and those who have compromised immune systems or chronic health conditions away from the patient. This includes people with chronic heart, lung, or kidney conditions, diabetes, and cancer.  Ensure good ventilation Make sure that shared spaces in the home have good air flow, such as from an air conditioner or an opened window, weather permitting.  Wash your hands often  Wash your hands often and thoroughly with soap and water for at least 20 seconds. You can use an alcohol based hand sanitizer if soap and water are not available and if your hands are not visibly dirty.  Avoid touching your eyes, nose, and mouth with unwashed hands.  Use disposable paper towels to dry your hands. If not available, use dedicated cloth towels and replace them when they become wet.  Wear a facemask and gloves  Wear a disposable facemask at all times in the room and gloves when you touch or have contact with the patients blood, body fluids, and/or secretions or excretions, such as sweat, saliva, sputum, nasal mucus, vomit, urine, or feces.  Ensure the mask fits over your nose and mouth tightly, and do not touch it during use.  Throw out disposable facemasks and gloves after using them. Do not reuse.  Wash your hands immediately after removing your facemask and gloves.  If your personal clothing becomes contaminated, carefully remove clothing and launder. Wash your hands after handling contaminated clothing.  Place all used disposable facemasks, gloves, and other waste in a lined  container before disposing them with other household waste.  Remove gloves and wash your hands immediately after handling these items.  Do not share dishes, glasses, or other household items with the patient  Avoid sharing household items. You should not share dishes, drinking glasses, cups, eating utensils, towels, bedding, or other items with a patient who is confirmed to have, or being evaluated for, COVID-19 infection.  After the person uses these items, you should wash them thoroughly with soap and water.  Wash laundry thoroughly  Immediately remove and wash clothes or bedding that have blood, body fluids, and/or secretions or excretions, such as sweat, saliva, sputum, nasal mucus, vomit, urine, or feces, on them.  Wear gloves when handling laundry from the patient.  Read and follow directions on labels of laundry or clothing items and detergent. In general, wash and dry with the warmest temperatures recommended on the label.  Clean all areas the individual has used often  Clean all touchable surfaces, such as counters, tabletops, doorknobs, bathroom fixtures, toilets, phones, keyboards, tablets, and bedside tables, every day. Also, clean any surfaces that may have blood, body fluids, and/or secretions or excretions on them.  Wear gloves when cleaning surfaces the patient has come in contact with.  Use a diluted bleach solution (e.g., dilute bleach with 1 part  bleach and 10 parts water) or a household disinfectant with a label that says EPA-registered for coronaviruses. To make a bleach solution at home, add 1 tablespoon of bleach to 1 quart (4 cups) of water. For a larger supply, add  cup of bleach to 1 gallon (16 cups) of water.  Read labels of cleaning products and follow recommendations provided on product labels. Labels contain instructions for safe and effective use of the cleaning product including precautions you should take when applying the product, such as wearing gloves or  eye protection and making sure you have good ventilation during use of the product.  Remove gloves and wash hands immediately after cleaning.  Monitor yourself for signs and symptoms of illness Caregivers and household members are considered close contacts, should monitor their health, and will be asked to limit movement outside of the home to the extent possible. Follow the monitoring steps for close contacts listed on the symptom monitoring form.   ? If you have additional questions, contact your local health department or call the epidemiologist on call at [phone] (available 24/7). ? This guidance is subject to change. For the most up-to-date guidance from [facility], please refer to their website: [email or web address]

## 2019-05-07 NOTE — Discharge Summary (Signed)
PATIENT DETAILS Name: Kelsey Morrison Age: 85 y.o. Sex: female Date of Birth: 1934-07-08 MRN: [id number]. Admitting Physician: Jonetta Osgood, MD WNI:[email or web address], Jeneen Rinks, MD  Admit Date: 05/04/2019 Discharge date: 05/07/2019  Recommendations for Outpatient Follow-up:  1. Follow up with PCP in 1-2 weeks 2. Please obtain BMP/CBC in one week   Admitted From:  SNF  Disposition: SNF   Home Health: No  Equipment/Devices: None  Discharge Condition: Stable  CODE STATUS: DNR  Diet recommendation:  Heart Healthy  Brief Summary: See H&P, Labs, Consult and Test reports for all details in brief, Patient is a 85 y.o. female with PMHx of dementia, HTN-who was diagnosed with COVID-19 approximately 3 weeks back-presented with worsening lethargy, shortness of breath-she was found to have COVID-19 viral pneumonia, hypernatremia and hypokalemia.  She was subsequently admitted to the triad hospitalist service at [facility].  Brief Hospital Course: Acute Hypoxic Resp Failure due to Covid 19 Viral pneumonia:  Rapidly improved with just supportive care-she is on room air.  Had very minimal oxygen requirements when she first presented.  CRP was significantly elevated-but this appeared to be disproportionate to her hypoxia-and has now started to trend down by itself. .  Furthermore she is almost 3 weeks out from her initial diagnosis-and not likely to benefit from any immunomodulators or Remdesivir at this point.  Hypernatremia: Resolved  Hypokalemia: Repleted  Hypertension: Controlled-continue with amlodipine  Dementia with delirium: At risk for delirium-remains very pleasantly confused-continue with Namenda, olanzapine and Remeron  Debility/deconditioning: Suspect debilitated at baseline-probably worsened due to dehydration and COVID-19 viral pneumonia.  Continue Rehab services at SNF as long as patient able to cooperate.  Palliative care: DNR in [place] frail and in poor overall  health-clearly not a candidate for aggressive care.  Consider palliative care follow-up at SNF.  If she were to decline significantly in the future-she would be very appropriate to transition to comfort measures.  Procedures/Studies: None  Discharge Diagnoses:  Principal Problem:   COVID-19 virus infection Active Problems:   HTN (hypertension)   Alzheimer's dementia ([place])   A-fib ([place])   Acute respiratory failure with hypoxia [facility])   Discharge Instructions:  Activity:  As tolerated   Discharge Instructions    Diet - low sodium heart healthy   Complete by:  As directed     Diet recommendations: Regular;Thin liquid Liquids provided via: Straw;Cup Medication Administration: Whole meds with liquid Supervision: Staff to assist with self feeding Compensations: Minimize environmental distractions;Slow rate;Small sips/bites Postural Changes and/or Swallow Maneuvers: Seated upright 90 degrees     Discharge instructions   Complete by:  As directed    Follow with Primary MD  Maryland Pink, MD in 1 week  Please get a complete blood count and chemistry panel checked by your Primary MD at your next visit, and again as instructed by your Primary MD.  Get Medicines reviewed and adjusted: Please take all your medications with you for your next visit with your Primary MD  Laboratory/radiological data: Please request your Primary MD to go over all hospital tests and procedure/radiological results at the follow up, please ask your Primary MD to get all Hospital records sent to his/her office.  In some cases, they will be blood work, cultures and biopsy results pending at the time of your discharge. Please request that your primary care M.D. follows up on these results.  Also Note the following: If you experience worsening of your admission symptoms, develop shortness of breath, life threatening emergency, suicidal or homicidal thoughts  you must seek medical attention immediately by  calling 911 or calling your MD immediately  if symptoms less severe.  You must read complete instructions/literature along with all the possible adverse reactions/side effects for all the Medicines you take and that have been prescribed to you. Take any new Medicines after you have completely understood and accpet all the possible adverse reactions/side effects.   Do not drive when taking Pain medications or sleeping medications (Benzodaizepines)  Do not take more than prescribed Pain, Sleep and Anxiety Medications. It is not advisable to combine anxiety,sleep and pain medications without talking with your primary care practitioner  Special Instructions: If you have smoked or chewed Tobacco  in the last 2 yrs please stop smoking, stop any regular Alcohol  and or any Recreational drug use.  Wear Seat belts while driving.  Please note: You were cared for by a hospitalist during your hospital stay. Once you are discharged, your primary care physician will handle any further medical issues. Please note that NO REFILLS for any discharge medications will be authorized once you are discharged, as it is imperative that you return to your primary care physician (or establish a relationship with a primary care physician if you do not have one) for your post hospital discharge needs so that they can reassess your need for medications and monitor your lab values.  ?   Person Under Monitoring Name: Kelsey Morrison  Location: [place] [address]   Infection Prevention Recommendations for Individuals Confirmed to have, or Being Evaluated for, 2019 Novel Coronavirus (COVID-19) Infection Who Receive Care at Home  Individuals who are confirmed to have, or are being evaluated for, COVID-19 should follow the prevention steps below until a healthcare provider or local or state health department says they can return to normal activities.  Stay home except to get medical care You should  restrict activities outside your home, except for getting medical care. Do not go to work, school, or public areas, and do not use public transportation or taxis.  Call ahead before visiting your doctor Before your medical appointment, call the healthcare provider and tell them that you have, or are being evaluated for, COVID-19 infection. This will help the healthcare provider's office take steps to keep other people from getting infected. Ask your healthcare provider to call the local or state health department.  Monitor your symptoms Seek prompt medical attention if your illness is worsening (e.g., difficulty breathing). Before going to your medical appointment, call the healthcare provider and tell them that you have, or are being evaluated for, COVID-19 infection. Ask your healthcare provider to call the local or state health department.  Wear a facemask You should wear a facemask that covers your nose and mouth when you are in the same room with other people and when you visit a healthcare provider. People who live with or visit you should also wear a facemask while they are in the same room with you.  Separate yourself from other people in your home As much as possible, you should stay in a different room from other people in your home. Also, you should use a separate bathroom, if available.  Avoid sharing household items You should not share dishes, drinking glasses, cups, eating utensils, towels, bedding, or other items with other people in your home. After using these items, you should wash them thoroughly with soap and water.  Cover your coughs and sneezes Cover your mouth and nose with a tissue when you  cough or sneeze, or you can cough or sneeze into your sleeve. Throw used tissues in a lined trash can, and immediately wash your hands with soap and water for at least 20 seconds or use an alcohol-based hand rub.  Wash your [facility] your hands often and thoroughly with  soap and water for at least 20 seconds. You can use an alcohol-based hand sanitizer if soap and water are not available and if your hands are not visibly dirty. Avoid touching your eyes, nose, and mouth with unwashed hands.   Prevention Steps for Caregivers and Household Members of Individuals Confirmed to have, or Being Evaluated for, COVID-19 Infection Being Cared for in the Home  If you live with, or provide care at home for, a person confirmed to have, or being evaluated for, COVID-19 infection please follow these guidelines to prevent infection:  Follow healthcare provider's instructions Make sure that you understand and can help the patient follow any healthcare provider instructions for all care.  Provide for the patient's basic needs You should help the patient with basic needs in the home and provide support for getting groceries, prescriptions, and other personal needs.  Monitor the patient's symptoms If they are getting sicker, call his or her medical provider and tell them that the patient has, or is being evaluated for, COVID-19 infection. This will help the healthcare provider's office take steps to keep other people from getting infected. Ask the healthcare provider to call the local or state health department.  Limit the number of people who have contact with the patient If possible, have only one caregiver for the patient. Other household members should stay in another home or place of residence. If this is not possible, they should stay in another room, or be separated from the patient as much as possible. Use a separate bathroom, if available. Restrict visitors who do not have an essential need to be in the home.  Keep older adults, very young children, and other sick people away from the patient Keep older adults, very young children, and those who have compromised immune systems or chronic health conditions away from the patient. This includes people with chronic  heart, lung, or kidney conditions, diabetes, and cancer.  Ensure good ventilation Make sure that shared spaces in the home have good air flow, such as from an air conditioner or an opened window, weather permitting.  Wash your hands often Wash your hands often and thoroughly with soap and water for at least 20 seconds. You can use an alcohol based hand sanitizer if soap and water are not available and if your hands are not visibly dirty. Avoid touching your eyes, nose, and mouth with unwashed hands. Use disposable paper towels to dry your hands. If not available, use dedicated cloth towels and replace them when they become wet.  Wear a facemask and gloves Wear a disposable facemask at all times in the room and gloves when you touch or have contact with the patient's blood, body fluids, and/or secretions or excretions, such as sweat, saliva, sputum, nasal mucus, vomit, urine, or feces.  Ensure the mask fits over your nose and mouth tightly, and do not touch it during use. Throw out disposable facemasks and gloves after using them. Do not reuse. Wash your hands immediately after removing your facemask and gloves. If your personal clothing becomes contaminated, carefully remove clothing and launder. Wash your hands after handling contaminated clothing. Place all used disposable facemasks, gloves, and other waste in a lined container  before disposing them with other household waste. Remove gloves and wash your hands immediately after handling these items.  Do not share dishes, glasses, or other household items with the patient Avoid sharing household items. You should not share dishes, drinking glasses, cups, eating utensils, towels, bedding, or other items with a patient who is confirmed to have, or being evaluated for, COVID-19 infection. After the person uses these items, you should wash them thoroughly with soap and water.  Wash laundry thoroughly Immediately remove and wash clothes or  bedding that have blood, body fluids, and/or secretions or excretions, such as sweat, saliva, sputum, nasal mucus, vomit, urine, or feces, on them. Wear gloves when handling laundry from the patient. Read and follow directions on labels of laundry or clothing items and detergent. In general, wash and dry with the warmest temperatures recommended on the label.  Clean all areas the individual has used often Clean all touchable surfaces, such as counters, tabletops, doorknobs, bathroom fixtures, toilets, phones, keyboards, tablets, and bedside tables, every day. Also, clean any surfaces that may have blood, body fluids, and/or secretions or excretions on them. Wear gloves when cleaning surfaces the patient has come in contact with. Use a diluted bleach solution (e.g., dilute bleach with 1 part bleach and 10 parts water) or a household disinfectant with a label that says EPA-registered for coronaviruses. To make a bleach solution at home, add 1 tablespoon of bleach to 1 quart (4 cups) of water. For a larger supply, add  cup of bleach to 1 gallon (16 cups) of water. Read labels of cleaning products and follow recommendations provided on product labels. Labels contain instructions for safe and effective use of the cleaning product including precautions you should take when applying the product, such as wearing gloves or eye protection and making sure you have good ventilation during use of the product. Remove gloves and wash hands immediately after cleaning.  Monitor yourself for signs and symptoms of illness Caregivers and household members are considered close contacts, should monitor their health, and will be asked to limit movement outside of the home to the extent possible. Follow the monitoring steps for close contacts listed on the symptom monitoring form.   ? If you have additional questions, contact your local health department or call the epidemiologist on call at [phone] (available  24/7). ? This guidance is subject to change. For the most up-to-date guidance from [facility], please refer to their website: YouBlogs.pl   Increase activity slowly   Complete by:  As directed      Allergies as of 05/07/2019      Reactions   Celecoxib Other (See Comments)   Hydrocodone Other (See Comments)   Altered mental status      Medication List    STOP taking these medications   diltiazem 120 MG 24 hr capsule Commonly known as:  CARDIZEM CD   metoprolol tartrate 25 MG tablet Commonly known as:  LOPRESSOR     TAKE these medications   alendronate 70 MG tablet Commonly known as:  FOSAMAX Take 70 mg by mouth once a week. Take with a full glass of water on an empty stomach.   amLODipine 5 MG tablet Commonly known as:  NORVASC Take 5 mg by mouth daily.   antiseptic oral rinse Liqd 15 mLs by Mouth Rinse route at bedtime as needed for dry mouth.   aspirin EC 81 MG tablet Take 81 mg by mouth at bedtime.   calcium citrate 950 MG tablet Commonly known  as:  CALCITRATE - dosed in mg elemental calcium Take 200 mg of elemental calcium by mouth daily.   famotidine 20 MG tablet Commonly known as:  PEPCID Take 20 mg by mouth daily.   memantine 10 MG tablet Commonly known as:  NAMENDA Take 10 mg by mouth 2 (two) times daily.   mirtazapine 7.5 MG tablet Commonly known as:  REMERON Take 7.5 mg by mouth at bedtime.   OLANZapine 2.5 MG tablet Commonly known as:  ZYPREXA Take 2.5 mg by mouth daily.   venlafaxine 75 MG tablet Commonly known as:  EFFEXOR Take 75 mg by mouth daily.   vitamin B-12 1000 MCG tablet Commonly known as:  CYANOCOBALAMIN Take 1,000 mcg by mouth daily.      Follow-up Information    Maryland Pink, MD. Schedule an appointment as soon as possible for a visit in 1 week(s).   Specialty:  Family Medicine Contact information: [address] [place] [address] [phone]           Allergies  Allergen Reactions  . Celecoxib Other (See Comments)  . Hydrocodone Other (See Comments)    Altered mental status    Consultations:   None  Other Procedures/Studies: Dg Chest Port 1 View  Result Date: 05/04/2019 CLINICAL DATA:  Shortness of breath and poor oxygen saturation. History of coronavirus infection. EXAM: PORTABLE CHEST 1 VIEW COMPARISON:  12/21/2018 FINDINGS: Artifact overlies the chest. Heart size remains normal. There is atherosclerosis of the aorta. There are newly seen widespread patchy pulmonary infiltrates without dense consolidation. The findings could be consistent with viral or bacterial pneumonia. No visible effusion. Chronic degenerative changes of the left shoulder. No acute bone finding. IMPRESSION: Newly seen widespread patchy pulmonary infiltrates which could be consistent with viral or bacterial pneumonia. No dense consolidation, lobar collapse or visible pleural effusion. Given the history of coronavirus infection, findings are consistent with that diagnosis. Electronically Signed   By: Nelson Chimes M.D.   On: 05/04/2019 10:54      TODAY-DAY OF DISCHARGE:  Subjective:   Kelsey Morrison today remains pleasantly confused  Objective:   Blood pressure (!) 118/58, pulse 91, temperature 97.8 F (36.6 C), temperature source Axillary, resp. rate 17, height 5\' 2"  (1.575 m), weight 48.8 kg, SpO2 99 %.  Intake/Output Summary (Last 24 hours) at 05/07/2019 1036 Last data filed at 05/06/2019 1710 Gross per 24 hour  Intake 20 ml  Output -  Net 20 ml   Filed Weights   05/04/19 1743  Weight: 48.8 kg    Exam: Pleasantly confused-but not in any distress,  No new F.N deficits, Normal affect [place].AT,PERRAL Supple Neck,No JVD, No cervical lymphadenopathy appriciated.  Symmetrical Chest wall movement, Good air movement bilaterally, CTAB RRR,No Gallops,Rubs or new Murmurs, No Parasternal Heave +ve B.Sounds, Abd Soft, Non tender, No organomegaly appriciated, No  rebound -guarding or rigidity. No Cyanosis, Clubbing or edema, No new Rash or bruise   PERTINENT RADIOLOGIC STUDIES: Dg Chest Port 1 View  Result Date: 05/04/2019 CLINICAL DATA:  Shortness of breath and poor oxygen saturation. History of coronavirus infection. EXAM: PORTABLE CHEST 1 VIEW COMPARISON:  12/21/2018 FINDINGS: Artifact overlies the chest. Heart size remains normal. There is atherosclerosis of the aorta. There are newly seen widespread patchy pulmonary infiltrates without dense consolidation. The findings could be consistent with viral or bacterial pneumonia. No visible effusion. Chronic degenerative changes of the left shoulder. No acute bone finding. IMPRESSION: Newly seen widespread patchy pulmonary infiltrates which could be consistent with viral  or bacterial pneumonia. No dense consolidation, lobar collapse or visible pleural effusion. Given the history of coronavirus infection, findings are consistent with that diagnosis. Electronically Signed   By: Nelson Chimes M.D.   On: 05/04/2019 10:54     PERTINENT LAB RESULTS: CBC: Recent Labs    05/05/19 0305 05/06/19 0409  WBC 8.8 9.9  HGB 8.9* 9.9*  HCT 31.2* 34.0*  PLT 322 344   CMET CMP     Component Value Date/Time   NA 145 05/06/2019 0409   NA 134 (L) 02/15/2014 1143   K 4.4 05/06/2019 0409   K 4.1 02/15/2014 1143   CL 116 (H) 05/06/2019 0409   CL 104 02/15/2014 1143   CO2 21 (L) 05/06/2019 0409   CO2 27 02/15/2014 1143   GLUCOSE 61 (L) 05/06/2019 0409   GLUCOSE 73 02/15/2014 1143   BUN 15 05/06/2019 0409   BUN 13 02/15/2014 1143   CREATININE 0.62 05/06/2019 0409   CREATININE 0.50 (L) 02/15/2014 1143   CALCIUM 7.9 (L) 05/06/2019 0409   CALCIUM 8.1 (L) 02/15/2014 1143   PROT 6.1 (L) 05/06/2019 0409   PROT 6.1 (L) 02/14/2014 0459   ALBUMIN 2.1 (L) 05/06/2019 0409   ALBUMIN 2.4 (L) 02/14/2014 0459   AST 33 05/06/2019 0409   AST 89 (H) 02/14/2014 0459   ALT 15 05/06/2019 0409   ALT 30 02/14/2014 0459    ALKPHOS 99 05/06/2019 0409   ALKPHOS 78 02/14/2014 0459   BILITOT 0.1 (L) 05/06/2019 0409   BILITOT 0.2 02/14/2014 0459   GFRNONAA >60 05/06/2019 0409   GFRNONAA >60 02/15/2014 1143   GFRAA >60 05/06/2019 0409   GFRAA >60 02/15/2014 1143    GFR Estimated Creatinine Clearance: 41.8 mL/min (by C-G formula based on SCr of 0.62 mg/dL). No results for input(s): LIPASE, AMYLASE in the last 72 hours. No results for input(s): CKTOTAL, CKMB, CKMBINDEX, TROPONINI in the last 72 hours. Invalid input(s): POCBNP No results for input(s): DDIMER in the last 72 hours. No results for input(s): HGBA1C in the last 72 hours. No results for input(s): CHOL, HDL, LDLCALC, TRIG, CHOLHDL, LDLDIRECT in the last 72 hours. No results for input(s): TSH, T4TOTAL, T3FREE, THYROIDAB in the last 72 hours.  Invalid input(s): [place]    05/05/19 0305 05/06/19 0415  FERRITIN 738* 486*   Coags: No results for input(s): INR in the last 72 hours.  Invalid input(s): PT Microbiology: Recent Results (from the past 240 hour(s))  Urine Culture     Status: None   Collection Time: 05/02/19  6:30 PM  Result Value Ref Range Status   Specimen Description   Final    URINE, RANDOM Performed at [facility], [address] [place], [place] [address]    Special Requests   Final    NONE Performed at [facility], [address] [place], [place] [address]    Culture   Final    NO GROWTH Performed at [facility], [place] [address] [place], [place] [address]    Report Status 05/03/2019 FINAL  Final  Blood Culture (routine x 2)     Status: None (Preliminary result)   Collection Time: 05/04/19  9:52 AM  Result Value Ref Range Status   Specimen Description BLOOD RIGHT ANTECUBITAL  Final   Special Requests   Final    BOTTLES DRAWN AEROBIC AND ANAEROBIC Blood Culture adequate volume   Culture   Final    NO GROWTH 3 DAYS Performed at [facility], [address],  [place], [place] [address]    Report Status PENDING  Incomplete  Blood Culture (routine x 2)     Status: None (Preliminary result)   Collection Time: 05/04/19  9:52 AM  Result Value Ref Range Status   Specimen Description BLOOD LEFT HAND  Final   Special Requests   Final    BOTTLES DRAWN AEROBIC AND ANAEROBIC Blood Culture adequate volume   Culture   Final    NO GROWTH 3 DAYS Performed at [facility], [address] [place], [place] [address]    Report Status PENDING  Incomplete  SARS Coronavirus 2 (CEPHEID- Performed in [place] hospital lab), Hosp Order     Status: Abnormal   Collection Time: 05/04/19  9:52 AM  Result Value Ref Range Status   SARS Coronavirus 2 POSITIVE (A) NEGATIVE Final    Comment: RESULT CALLED TO, READ BACK BY AND VERIFIED WITH: Wallie Char RN AT 2947 ON 05/04/19 SG (NOTE) If result is NEGATIVE SARS-CoV-2 target nucleic acids are NOT DETECTED. The SARS-CoV-2 RNA is generally detectable in upper and lower  respiratory specimens during the acute phase of infection. The lowest  concentration of SARS-CoV-2 viral copies this assay can detect is 250  copies / mL. A negative result does not preclude SARS-CoV-2 infection  and should not be used as the sole basis for treatment or other  patient management decisions.  A negative result may occur with  improper specimen collection / handling, submission of specimen other  than nasopharyngeal swab, presence of viral mutation(s) within the  areas targeted by this assay, and inadequate number of viral copies  (<250 copies / mL). A negative result must be combined with clinical  observations, patient history, and epidemiological information. If result is POSITIVE SARS-CoV-2 target nucleic acids are DETECTED.  The SARS-CoV-2 RNA is generally detectable in upper and lower  respiratory specimens during the acute phase of infection.  Positive  results are indicative of active infection with SARS-CoV-2.  Clinical   correlation with patient history and other diagnostic information is  necessary to determine patient infection status.  Positive results do  not rule out bacterial infection or co-infection with other viruses. If result is PRESUMPTIVE POSTIVE SARS-CoV-2 nucleic acids MAY BE PRESENT.   A presumptive positive result was obtained on the submitted specimen  and confirmed on repeat testing.  While 2019 novel coronavirus  (SARS-CoV-2) nucleic acids may be present in the submitted sample  additional confirmatory testing may be necessary for epidemiological  and / or clinical management purposes  to differentiate between  SARS-CoV-2 and other Sarbecovirus currently known to infect humans.  If clinically indicated additional testing with an alternate test  methodology [phone])  is advised. The SARS-CoV-2 RNA is generally  detectable in upper and lower respiratory specimens during the acute  phase of infection. The expected result is Negative. Fact Sheet for Patients:  StrictlyIdeas.no Fact Sheet for Healthcare Providers: BankingDealers.co.za This test is not yet approved or cleared by the [place] FDA and has been authorized for detection and/or diagnosis of SARS-CoV-2 by FDA under an Emergency Use Authorization (EUA).  This EUA will remain in effect (meaning this test can be used) for the duration of the COVID-19 declaration under Section 564(b)(1) of the Act, 21 U.S.C. section 360bbb-3(b)(1), unless the authorization is terminated or revoked sooner. Performed at [facility], [address], [place], [place] [address]     FURTHER DISCHARGE INSTRUCTIONS:  Get Medicines reviewed  and adjusted: Please take all your medications with you for your next visit with your Primary MD  Laboratory/radiological data: Please request your Primary MD to go over all hospital tests and procedure/radiological results at the follow up, please  ask your Primary MD to get all Hospital records sent to his/her office.  In some cases, they will be blood work, cultures and biopsy results pending at the time of your discharge. Please request that your primary care M.D. goes through all the records of your hospital data and follows up on these results.  Also Note the following: If you experience worsening of your admission symptoms, develop shortness of breath, life threatening emergency, suicidal or homicidal thoughts you must seek medical attention immediately by calling 911 or calling your MD immediately  if symptoms less severe.  You must read complete instructions/literature along with all the possible adverse reactions/side effects for all the Medicines you take and that have been prescribed to you. Take any new Medicines after you have completely understood and accpet all the possible adverse reactions/side effects.   Do not drive when taking Pain medications or sleeping medications (Benzodaizepines)  Do not take more than prescribed Pain, Sleep and Anxiety Medications. It is not advisable to combine anxiety,sleep and pain medications without talking with your primary care practitioner  Special Instructions: If you have smoked or chewed Tobacco  in the last 2 yrs please stop smoking, stop any regular Alcohol  and or any Recreational drug use.  Wear Seat belts while driving.  Please note: You were cared for by a hospitalist during your hospital stay. Once you are discharged, your primary care physician will handle any further medical issues. Please note that NO REFILLS for any discharge medications will be authorized once you are discharged, as it is imperative that you return to your primary care physician (or establish a relationship with a primary care physician if you do not have one) for your post hospital discharge needs so that they can reassess your need for medications and monitor your lab values.  Total Time spent coordinating  discharge including counseling, education and face to face time equals 35 minutes.  SignedOren Binet 05/07/2019 10:36 AM

## 2019-05-09 LAB — CULTURE, BLOOD (ROUTINE X 2)
Culture: NO GROWTH
Culture: NO GROWTH
Special Requests: ADEQUATE
Special Requests: ADEQUATE

## 2019-05-12 ENCOUNTER — Other Ambulatory Visit: Payer: Self-pay

## 2019-05-12 ENCOUNTER — Non-Acute Institutional Stay: Payer: Medicare Other | Admitting: Primary Care

## 2019-05-14 ENCOUNTER — Non-Acute Institutional Stay: Payer: Medicare Other | Admitting: Primary Care

## 2019-05-14 ENCOUNTER — Other Ambulatory Visit: Payer: Self-pay

## 2019-05-19 ENCOUNTER — Non-Acute Institutional Stay: Payer: Medicare Other | Admitting: Primary Care

## 2019-05-19 ENCOUNTER — Other Ambulatory Visit: Payer: Self-pay

## 2019-05-19 DIAGNOSIS — Z515 Encounter for palliative care: Secondary | ICD-10-CM

## 2019-05-19 NOTE — Progress Notes (Signed)
[facility] Pall[facility] Care Consult Note Telephone: [phone]  Fax: [phone]  TELEHEALTH VISIT STATEMENT Due to the COVID-19 crisis, this visit was done via telemedicine from my office. It was initiated and consented to by this patient and/or family.  PATIENT NAME: CENIYAH THORP DOB: 1934/10/09 MRN: [id number]  PRIMARY CARE PROVIDER:  Alvester Morin, [place]  REFERRING PROVIDER:  Alvester Morin, MD [place]. [place], [place] [address]   RESPONSIBLE PARTY:   Extended Emergency Contact Information Primary Emergency Contact: Keenum,Bennie Address: [address]          [place],  [address] Johnnette Litter of [place] Phone: [phone] Relation: Spouse Secondary Emergency Contact: Janalyn Harder States of [place] Phone: [phone] Work Phone: [phone] Relation: Son  [place] was asked to follow patient by consultation request of  Alvester Morin, MD This is the initial visit.  ASSESSMENT AND RECOMMENDATIONS:   1. Goals of Care: Maximize quality of life and symptom management.  2. Symptom Management:  Constipation: No needed intervention, may use bowel protocol at SNF if no bm x 3 days. Pain: Reports no pain, encouraged prn per protocol if needed. Nutrition: Eats fair, takes medpass. 4 lb wt loss over several months. No weight since hospitalization, will continue to follow.  3. Family Supports: Has husband, Rogers Seeds who is POA. Also has son who participates in care. Spoke with Betsy Pries RE palliative care initiation.  4. Cognitive / Functional decline: Weaker from covid infection and is in the process of rehabilitation. She is particpating in PT. Her baseline was ability to wheel self in w/c after assistance into chair by pivot transfer. This is the goal for her to reach. States she will work with PT to get stronger.  5. Advanced Care Directive: DNR on Vynca  from SNF MD. T/c to husband Iza Preston to discuss referral. Gave up date. He stated he has not seen his wife in several months. He is concerned about her eating. I let him know she takes all her medpass and is working with PT currently. He expressed appreciation at visit and gave consent for this and further visits.   6. Follow up Palliative Care Visit: Palliative care will continue to follow for goals of care clarification and symptom management. Return 4-6 weeks or prn.  I spent 25 minutes providing this consultation,  from 1100 to 1125. More than 50% of the time in this consultation was spent coordinating communication.   HISTORY OF PRESENT ILLNESS:  YANIS JUMA is a 85 y.o. year old female with multiple medical problems including OA, AD, h/o cancer, anxiety/depression, oral ulcers. Palliative Care was asked to help address goals of care.   CODE STATUS:  DNR on VYNCA PPS: 30% HOSPICE ELIGIBILITY/DIAGNOSIS: TBD  PAST MEDICAL HISTORY:  Past Medical History:  Diagnosis Date  . Alzheimer's dementia ([place])   . Anxiety   . Cancer [facility])    breast cancer  . Depression   . Dry mouth   . HLD (hyperlipidemia)   . Hypertension   . Inflammatory arthritis    seronegative  . Osteoarthritis     SOCIAL HX:  Social History   Tobacco Use  . Smoking status: Never Smoker  . Smokeless tobacco: Never Used  Substance Use Topics  . Alcohol use: No    ALLERGIES:  Allergies  Allergen Reactions  . Celecoxib Other (See Comments)  . Hydrocodone Other (See Comments)    Altered mental status  PERTINENT MEDICATIONS:  Outpatient Encounter Medications as of 05/19/2019  Medication Sig  . alendronate (FOSAMAX) 70 MG tablet Take 70 mg by mouth once a week. Take with a full glass of water on an empty stomach.  Marland Kitchen amLODipine (NORVASC) 5 MG tablet Take 5 mg by mouth daily.  Marland Kitchen antiseptic oral rinse (BIOTENE) LIQD 15 mLs by Mouth Rinse route at bedtime as needed for dry mouth.   Marland Kitchen aspirin EC 81 MG  tablet Take 81 mg by mouth at bedtime.  . calcium citrate (CALCITRATE - DOSED IN MG ELEMENTAL CALCIUM) 950 MG tablet Take 200 mg of elemental calcium by mouth daily.  . famotidine (PEPCID) 20 MG tablet Take 20 mg by mouth daily.  . memantine (NAMENDA) 10 MG tablet Take 10 mg by mouth 2 (two) times daily.  . mirtazapine (REMERON) 7.5 MG tablet Take 7.5 mg by mouth at bedtime.  Marland Kitchen OLANZapine (ZYPREXA) 2.5 MG tablet Take 2.5 mg by mouth daily.  Marland Kitchen venlafaxine (EFFEXOR) 75 MG tablet Take 75 mg by mouth daily.  . vitamin B-12 (CYANOCOBALAMIN) 1000 MCG tablet Take 1,000 mcg by mouth daily.   No facility-administered encounter medications on file as of 05/19/2019.     PHYSICAL EXAM/ROS:  116/66  91 HR 17 RR 97.5 Temp  99% RA Subjective wt stable.  Current and past weights:Hospital wt 108 lb on 5/29, Last SNF weight 112 in 03/2019 General: NAD, frail appearing,  HEENT Mouth sores rx with magic mouth wash Cardiovascular: no chest pain reported, no edema,  Pulmonary: dry cough, no increased SOB, had covid and went to hospital, d/c 5/29 from [facility], was not on the ventilator in hospital.  Abdomen: appetite fair, takes med-pass tid, denies constipation, asks to use bathroom and is assisted GU: denies dysuria, continent of urine MSK:  no joint deformities, non ambulatory, pivot transfers to chair and was able to self roll at baseline Weak from covid infection.   Skin: no rashes or wounds reported Neurological: Weakness from Covid, confusion at first on return from hospital, h/o dementia, more at baseline mentally. FAST 6d-7a. States she feels weak. Able to smile, states little pain.  Cyndia Skeeters  DNP, AGPCNP-BC

## 2019-06-23 ENCOUNTER — Non-Acute Institutional Stay: Payer: Medicare Other | Admitting: Primary Care

## 2019-06-23 ENCOUNTER — Other Ambulatory Visit: Payer: Self-pay

## 2019-06-23 DIAGNOSIS — Z515 Encounter for palliative care: Secondary | ICD-10-CM

## 2019-06-23 NOTE — Progress Notes (Signed)
[facility] Palliative Care Consult Note Telephone: [phone]  Fax: [phone]  TELEHEALTH VISIT STATEMENT Due to the COVID-19 crisis, this visit was done via telemedicine from my office. It was initiated and consented to by this patient and/or family.  PATIENT NAME: Kelsey Morrison DOB: 1934/04/26 MRN: [id number]  PRIMARY CARE PROVIDER:   Alvester Morin, [place]  REFERRING PROVIDER:  Alvester Morin, MD [place]. [place],  [place] [address] [phone]  RESPONSIBLE PARTY:   Extended Emergency Contact Information Primary Emergency Contact: Kelsey Morrison Address: [address]          [place], [place] [address] Johnnette Litter of [place] Phone: [phone] Relation: Spouse Secondary Emergency Contact: Kelsey Morrison States of [place] Phone: [phone] Work Phone: [phone] Relation: Son  [place] was asked to follow this patient by consultation request of Alvester Morin, MD. This is a follow up visit.  ASSESSMENT AND RECOMMENDATIONS:    1. Goals of Care: Maximize quality of life and symptom management.   2. Symptom Management:   Constipation: No current complaint, managed as needed with facility protocol.  Pain: Denies usually but needs repositioned when on her seat too long, she will have discomfort from too much sitting. Staff is also using pillows.  Nutrition: Eats fair, takes medpass. Weight now stable 108, recently weighed and similar from several months ago   3. Family Supports/Caregiver support: Has husband, Kelsey Morrison who is POA. Calls 3x/week. Had a video chat with family several days ago.    4. Cognitive / Functional decline: Weaker from covid infection and is in the process of rehabilitation. She is participating in PT. Her baseline was ability to wheel self in w/c after assistance into chair by pivot transfer.  Working with PT on rolling w/c. Also doing OT  for adls of feeding and dressing, addressing hand contractures.    5. Advanced Care Directive: DNR on Vynca from SNF MD.    6. Follow up Palliative Care Visit: Palliative care will continue to follow for goals of care clarification and symptom management. Return 4-6 weeks or prn.   I spent 25 minutes providing this consultation, from 1200 to 1225. More than 50% of the time in this consultation was spent coordinating communication.    HISTORY OF PRESENT ILLNESS:  Kelsey Morrison is a 85 y.o. year old female with multiple medical problems including covid infection, OA, AD, h/o cancer, anxiety/depression, oral ulcers. Palliative Care was asked to help address goals of care.    CODE STATUS:  DNR  PPS: 30% HOSPICE ELIGIBILITY/DIAGNOSIS: TBD  PAST MEDICAL HISTORY:  Past Medical History:  Diagnosis Date  . Alzheimer's dementia ([place])   . Anxiety   . Cancer [facility])    breast cancer  . Depression   . Dry mouth   . HLD (hyperlipidemia)   . Hypertension   . Inflammatory arthritis    seronegative  . Osteoarthritis     SOCIAL HX:  Social History   Tobacco Use  . Smoking status: Never Smoker  . Smokeless tobacco: Never Used  Substance Use Topics  . Alcohol use: No    ALLERGIES:  Allergies  Allergen Reactions  . Celecoxib Other (See Comments)  . Hydrocodone Other (See Comments)    Altered mental status     PERTINENT MEDICATIONS:  Outpatient Encounter Medications as of 06/23/2019  Medication Sig  . alendronate (FOSAMAX) 70 MG tablet Take 70 mg by mouth once a week. Take with a full glass of water  on an empty stomach.  Marland Kitchen amLODipine (NORVASC) 5 MG tablet Take 5 mg by mouth daily.  Marland Kitchen antiseptic oral rinse (BIOTENE) LIQD 15 mLs by Mouth Rinse route at bedtime as needed for dry mouth.   Marland Kitchen aspirin EC 81 MG tablet Take 81 mg by mouth at bedtime.  . calcium citrate (CALCITRATE - DOSED IN MG ELEMENTAL CALCIUM) 950 MG tablet Take 200 mg of elemental calcium by mouth daily.  . famotidine  (PEPCID) 20 MG tablet Take 20 mg by mouth daily.  . memantine (NAMENDA) 10 MG tablet Take 10 mg by mouth 2 (two) times daily.  . mirtazapine (REMERON) 7.5 MG tablet Take 7.5 mg by mouth at bedtime.  Marland Kitchen OLANZapine (ZYPREXA) 2.5 MG tablet Take 2.5 mg by mouth daily.  Marland Kitchen venlafaxine (EFFEXOR) 75 MG tablet Take 75 mg by mouth daily.  . vitamin B-12 (CYANOCOBALAMIN) 1000 MCG tablet Take 1,000 mcg by mouth daily.   No facility-administered encounter medications on file as of 06/23/2019.     PHYSICAL EXAM/ROS:   110/67 82 HR 17 RR 97.5=Temp  99% RA Current on 06/15/19 108.6 lb. Current and past weights: Hospital wt 108 lb on 5/29, Last SNF weight 112 in 03/2019 General: NAD, frail appearing, fatigued, min. interactive Cardiovascular: no chest pain reported, no edema,  Pulmonary: dry cough, no increased SOB, s/p covid infection  Abdomen: appetite increased, 75%, takes med-pass tid, denies constipation,  incontinent now, not going to bathroom for elimination any more, incontinent of bowel and bladder. GU: no signs UTI, incontinent of urine MSK:  no joint deformities, non-ambulatory, pivot transfers to chair and was able to self roll at baseline.  PT has been working with her but still needs therapy to adapt to regular wheelchair. Skin: R fa skin tear, no sacral breakdown  Neurological: Weakness from Covid, h/o dementia, more at baseline mentally. FAST 6d-7a. States she feels weak. Staff states c/o pain on prolonged sitting. Has periods of talking more than others.   Kelsey Skeeters DNP, AGPCNP-BC

## 2019-07-27 ENCOUNTER — Other Ambulatory Visit: Payer: Self-pay

## 2019-07-27 ENCOUNTER — Non-Acute Institutional Stay: Payer: Medicare Other | Admitting: Primary Care

## 2019-07-27 DIAGNOSIS — Z515 Encounter for palliative care: Secondary | ICD-10-CM

## 2019-07-27 NOTE — Progress Notes (Signed)
[facility] Palliative Care Consult Note Telephone: [phone]  Fax: [phone]  TELEHEALTH VISIT STATEMENT Due to the COVID-19 crisis, this visit was done via telemedicine from my office. It was initiated and consented to by this patient and/or family.  PATIENT NAME: Kelsey Morrison DOB: 07-Aug-1934 MRN: [id number]  PRIMARY CARE PROVIDER:   Alvester Morin, MD, [place]. [place] [address] [phone]  REFERRING PROVIDER:  Alvester Morin, MD [place]. [place],  [place] [address] [phone]  RESPONSIBLE PARTY:   Extended Emergency Contact Information Primary Emergency Contact: Dowell,Bennie Address: [address]          [place], [place] [address] Johnnette Litter of [place] Phone: [phone] Relation: Spouse Secondary Emergency Contact: Janalyn Harder States of [place] Phone: [phone] Work Phone: [phone] Relation: Son   ASSESSMENT AND RECOMMENDATIONS:   1. Advance Care Planning/Goals of Care: Goals include to maximize quality of life and symptom management.DNR on file. No MOST On file. I will discuss with PR RE creating one.  2. Symptom Management:   Nutrition: Appetite has improved by a lot and she has been gaining weight. She eats 100 % per staff report.  Mobility: Continue to encourage time out of bed. Finished with PT, not able to wheel self as she could prior to Covid infection and hospital stay. Encourage to be OOB and wheel self. Has not returned to pre covid baseline.  3. Family /Caregiver/Community Supports: Lives in LTC unit. Needs assistance with all IADLs and most adls.Has family in the area.   4. Cognitive / Functional decline: Cognitively alert, few words. Completed PT, goal was to have her wheel herself but has not achieved that.  She gets oob in a chair each day.  5. Follow up Palliative Care Visit: Palliative care will continue to follow for goals of  care clarification and symptom management. Return 4-6 weeks or prn.  I spent 15 minutes providing this consultation,  from 1030 to 1045. More than 50% of the time in this consultation was spent coordinating communication.   HISTORY OF PRESENT ILLNESS:  Kelsey Morrison is a 85 y.o. year old female with multiple medical problems including recent covid infection, OA, AD, h/o cancer, anxiety/depression, oral ulcers. Palliative Care was asked to follow this patient by consultation request of Slade-Hartman, Ivette Loyal* to help address advance care planning and goals of care. This is a follow up visit.   CODE STATUS: DNR  PPS: 30% HOSPICE ELIGIBILITY/DIAGNOSIS: TBD  PAST MEDICAL HISTORY:  Past Medical History:  Diagnosis Date  . Alzheimer's dementia ([place])   . Anxiety   . Cancer [facility])    breast cancer  . Depression   . Dry mouth   . HLD (hyperlipidemia)   . Hypertension   . Inflammatory arthritis    seronegative  . Osteoarthritis     SOCIAL HX:  Social History   Tobacco Use  . Smoking status: Never Smoker  . Smokeless tobacco: Never Used  Substance Use Topics  . Alcohol use: No    ALLERGIES:  Allergies  Allergen Reactions  . Celecoxib Other (See Comments)  . Hydrocodone Other (See Comments)    Altered mental status     PERTINENT MEDICATIONS:  Outpatient Encounter Medications as of 07/27/2019  Medication Sig  . alendronate (FOSAMAX) 70 MG tablet Take 70 mg by mouth once a week. Take with a full glass of water on an empty stomach.  Marland Kitchen amLODipine (NORVASC) 5 MG tablet Take 5 mg  by mouth daily.  Marland Kitchen antiseptic oral rinse (BIOTENE) LIQD 15 mLs by Mouth Rinse route at bedtime as needed for dry mouth.   Marland Kitchen aspirin EC 81 MG tablet Take 81 mg by mouth at bedtime.  . calcium citrate (CALCITRATE - DOSED IN MG ELEMENTAL CALCIUM) 950 MG tablet Take 200 mg of elemental calcium by mouth daily.  . famotidine (PEPCID) 20 MG tablet Take 20 mg by mouth daily.  . memantine (NAMENDA) 10 MG tablet  Take 10 mg by mouth 2 (two) times daily.  . mirtazapine (REMERON) 7.5 MG tablet Take 7.5 mg by mouth at bedtime.  Marland Kitchen OLANZapine (ZYPREXA) 2.5 MG tablet Take 2.5 mg by mouth daily.  Marland Kitchen venlafaxine (EFFEXOR) 75 MG tablet Take 75 mg by mouth daily.  . vitamin B-12 (CYANOCOBALAMIN) 1000 MCG tablet Take 1,000 mcg by mouth daily.   No facility-administered encounter medications on file as of 07/27/2019.     PHYSICAL EXAM / ROS:  98.0-66-16-100/68 Current and past weights: 115 lb, has gained from 108 lb  General: NAD, frail appearing, thin Cardiovascular: no chest pain reported, no edema,  Pulmonary: no cough, no increased SOB,  RA Abdomen: appetite excellent denies constipation, incontinent of bowel GU: denies dysuria, incontinent of urine MSK: sit to stand transfers, no joint deformities Skin: no rashes or wounds reported Neurological: Weakness, FAST score 6d , needs set up off meals   [place] AGPCNP-BC

## 2019-08-31 ENCOUNTER — Other Ambulatory Visit: Payer: Self-pay

## 2019-08-31 ENCOUNTER — Non-Acute Institutional Stay: Payer: Medicare Other | Admitting: Primary Care

## 2019-09-01 ENCOUNTER — Non-Acute Institutional Stay: Payer: Medicare Other | Admitting: Primary Care

## 2019-09-01 ENCOUNTER — Other Ambulatory Visit: Payer: Self-pay

## 2019-09-01 DIAGNOSIS — Z515 Encounter for palliative care: Secondary | ICD-10-CM

## 2019-09-01 NOTE — Progress Notes (Signed)
[facility] Palliative Care Consult Note Telephone: [phone]  Fax: [phone]  TELEHEALTH VISIT STATEMENT Due to the COVID-19 crisis, this visit was done via telemedicine from my office. It was initiated and consented to by this patient and/or family.  PATIENT NAME: Kelsey Morrison [id number] [phone] (home)  DOB: [birth year] MRN: [id number]  PRIMARY CARE PROVIDER:   Alvester Morin, MD, [place] [address] [phone]  REFERRING PROVIDER:  Alvester Morin, MD [place]. [place],  [place] [address] [phone]  RESPONSIBLE PARTY:   Extended Emergency Contact Information Primary Emergency Contact: Ky,Bennie Address: [address]          [place], [place] [address] Johnnette Litter of [place] Phone: [phone] Relation: Spouse Secondary Emergency Contact: Janalyn Harder States of [place] Phone: [phone] Work Phone: [phone] Relation: Son   ASSESSMENT AND RECOMMENDATIONS:   1. Advance Care Planning/Goals of Care: Goals include to maximize quality of life and symptom management. I need to reach POA for discussion RE care goals via MOST.  2. Symptom Management:   Pain: Recommend scheduled 650 mg tid, Recommend adding tramadol to each dose for tid. Chronic left arm pain, tramadol 25 mg bid, and 25 mg prn.   Mobility: Decreasing due to disease process. Had fall and is not able to mobilize any more now. She is in bed now mostly.   3. Family /Caregiver/Community Supports: Family calls and she speaks on speaker phone.   4. Cognitive / Functional decline: She is having more cognitive changes and decline.R knee has tendonitis and left shoulder has severe arthritis. She can no longer mobilizes herself.   5. Follow up Palliative Care Visit: Palliative care will continue to follow for goals of care clarification and symptom management.  Return 4 weeks or prn.  I spent 25 minutes providing this consultation,  from 1345 to 1410. More than 50% of the time in this consultation was spent coordinating communication.   HISTORY OF PRESENT ILLNESS:  Kelsey Morrison is a 85 y.o. year old female with multiple medical problems including AD. OA, debility, falls, weight loss, h/o breast cancer. Palliative Care was asked to follow this patient by consultation request of Slade-Hartman, Ivette Loyal* to help address advance care planning and goals of care. This is a follow up visit.  CODE STATUS: DNR  PPS: 30% HOSPICE ELIGIBILITY/DIAGNOSIS: TBD  PAST MEDICAL HISTORY:  Past Medical History:  Diagnosis Date  . Alzheimer's dementia ([place])   . Anxiety   . Cancer [facility])    breast cancer  . Depression   . Dry mouth   . HLD (hyperlipidemia)   . Hypertension   . Inflammatory arthritis    seronegative  . Osteoarthritis     SOCIAL HX:  Social History   Tobacco Use  . Smoking status: Never Smoker  . Smokeless tobacco: Never Used  Substance Use Topics  . Alcohol use: No    ALLERGIES:  Allergies  Allergen Reactions  . Celecoxib Other (See Comments)  . Hydrocodone Other (See Comments)    Altered mental status     PERTINENT MEDICATIONS:  Outpatient Encounter Medications as of 09/01/2019  Medication Sig  . alendronate (FOSAMAX) 70 MG tablet Take 70 mg by mouth once a week. Take with a full glass of water on an empty stomach.  Marland Kitchen amLODipine (NORVASC) 5 MG tablet Take 5 mg by mouth daily.  Marland Kitchen antiseptic oral rinse (BIOTENE) LIQD 15 mLs by Mouth Rinse  route at bedtime as needed for dry mouth.   Marland Kitchen aspirin EC 81 MG tablet Take 81 mg by mouth at bedtime.  . calcium citrate (CALCITRATE - DOSED IN MG ELEMENTAL CALCIUM) 950 MG tablet Take 200 mg of elemental calcium by mouth daily.  . famotidine (PEPCID) 20 MG tablet Take 20 mg by mouth daily.  . memantine (NAMENDA) 10 MG tablet Take 10 mg by mouth 2 (two) times daily.  . mirtazapine (REMERON)  7.5 MG tablet Take 7.5 mg by mouth at bedtime.  Marland Kitchen OLANZapine (ZYPREXA) 2.5 MG tablet Take 2.5 mg by mouth daily.  Marland Kitchen venlafaxine (EFFEXOR) 75 MG tablet Take 75 mg by mouth daily.  . vitamin B-12 (CYANOCOBALAMIN) 1000 MCG tablet Take 1,000 mcg by mouth daily.   No facility-administered encounter medications on file as of 09/01/2019.     PHYSICAL EXAM / ROS:   Current and past weights: 114 wt. Formerly 108 lb after her covid hosp. General: NAD, frail appearing, thin Cardiovascular: no chest pain reported, no edema,  Pulmonary: no cough, no increased SOB Abdomen:  Med pass 120 ml tid, appetite good, endorses constipation, incontinent of bowel GU: denies dysuria, incontinent of urine MSK:  no joint deformities, non-ambulatory, x ray for fall 08/1719, L shoulder and arm b/c she's had capsulitis, finding was arthritis. Skin: no rashes or wounds reported or noted Neurological: Weakness, dementia deficits, not able to converse, FAST 7a.  Cyndia Skeeters DNP AGPCNP-BC

## 2019-09-28 ENCOUNTER — Other Ambulatory Visit: Payer: Self-pay

## 2019-09-28 ENCOUNTER — Non-Acute Institutional Stay: Payer: Medicare Other | Admitting: Primary Care

## 2019-09-30 ENCOUNTER — Non-Acute Institutional Stay: Payer: Medicare Other | Admitting: Primary Care

## 2019-09-30 ENCOUNTER — Other Ambulatory Visit: Payer: Self-pay

## 2019-10-01 ENCOUNTER — Other Ambulatory Visit: Payer: Self-pay

## 2019-10-01 ENCOUNTER — Non-Acute Institutional Stay: Payer: Medicare Other | Admitting: Primary Care

## 2019-10-05 ENCOUNTER — Non-Acute Institutional Stay: Payer: Medicare Other | Admitting: Primary Care

## 2019-10-05 ENCOUNTER — Other Ambulatory Visit: Payer: Self-pay

## 2019-10-05 DIAGNOSIS — Z515 Encounter for palliative care: Secondary | ICD-10-CM

## 2019-10-05 NOTE — Progress Notes (Signed)
[facility] Consult Note Telephone: [phone]  Fax: [phone]  TELEHEALTH VISIT STATEMENT Due to the COVID-19 crisis, this visit was done via telemedicine from my office. It was initiated and consented to by this patient and/or family.  PATIENT NAME: Kelsey Morrison [id number] [phone] (home)  DOB: Jun 02, 1934 MRN: [id number]  PRIMARY CARE PROVIDER:   Alvester Morin, MD, [place] [address] [phone]  REFERRING PROVIDER:  Alvester Morin, MD [place]. [place],  [place] [address] [phone]  RESPONSIBLE PARTY:   Extended Emergency Contact Information Primary Emergency Contact: Stamp,Bennie Address: [address]          [place], [place] [address] Johnnette Litter of [place] Phone: [phone] Relation: Spouse Secondary Emergency Contact: Janalyn Harder [place] of [place] Phone: [phone] Work Phone: [phone] Relation: Son   ASSESSMENT AND RECOMMENDATIONS:   1. Advance Care Planning/Goals of Care: Goals include to maximize quality of life and symptom management.  2. Symptom Management:   Pain: was addressed by adding acetaminophen and tramadol. Appears to have improved pain managemaent but she still gets painful after sitting up a while and needs to lie down. Continue to monitor for improvement; she looks subjectively more comfortable and well. She could have an  Increase in  tramadol to 50 mg tid if needed, or 25 mg qid if her pain is still not well controlled. She can answer yes/no when she has pain.  Mobility: Seems improved, she was oob in a chair in perhaps a common area on our Zoom call. She was tired and wanted to return to bed but had tolerated being up a while. This is an improvement in her functional tolerance since last month.  3. Family /Caregiver/Community Supports: Family calls and speaks on the phone  with patient daily. They have not been able to make window visits.  4. Cognitive / Functional decline: Appears more interactive today than previous visits, and more mobile. Continue to increase pain medication in small increments to maximize mobility.  5. Follow up Palliative Care Visit: Palliative care will continue to follow for goals of care clarification and symptom management. Return 4-6 weeks or prn.  I spent 25 minutes providing this consultation,  from 1200 to 1225. More than 50% of the time in this consultation was spent coordinating communication.   HISTORY OF PRESENT ILLNESS:  Kelsey Morrison is a 85 y.o. year old female with multiple medical problems including AD. OA, debility, falls, weight loss, h/o breast cancer. Palliative Care was asked to follow this patient by consultation request of Slade-Hartman, Ivette Loyal* to help address advance care planning and goals of care. This is a follow up visit.  CODE STATUS: DNR  PPS: 30% HOSPICE ELIGIBILITY/DIAGNOSIS: TBD  PAST MEDICAL HISTORY:  Past Medical History:  Diagnosis Date  . Alzheimer's dementia ([place])   . Anxiety   . Cancer [facility])    breast cancer  . Depression   . Dry mouth   . HLD (hyperlipidemia)   . Hypertension   . Inflammatory arthritis    seronegative  . Osteoarthritis     SOCIAL HX:  Social History   Tobacco Use  . Smoking status: Never Smoker  . Smokeless tobacco: Never Used  Substance Use Topics  . Alcohol use: No    ALLERGIES:  Allergies  Allergen Reactions  . Celecoxib Other (See Comments)  . Hydrocodone Other (See Comments)    Altered mental status  PERTINENT MEDICATIONS:  Outpatient Encounter Medications as of 10/05/2019  Medication Sig  . alendronate (FOSAMAX) 70 MG tablet Take 70 mg by mouth once a week. Take with a full glass of water on an empty stomach.  Marland Kitchen amLODipine (NORVASC) 5 MG tablet Take 5 mg by mouth daily.  Marland Kitchen antiseptic oral rinse (BIOTENE) LIQD 15 mLs by Mouth Rinse route at  bedtime as needed for dry mouth.   Marland Kitchen aspirin EC 81 MG tablet Take 81 mg by mouth at bedtime.  . calcium citrate (CALCITRATE - DOSED IN MG ELEMENTAL CALCIUM) 950 MG tablet Take 200 mg of elemental calcium by mouth daily.  . famotidine (PEPCID) 20 MG tablet Take 20 mg by mouth daily.  . memantine (NAMENDA) 10 MG tablet Take 10 mg by mouth 2 (two) times daily.  . mirtazapine (REMERON) 7.5 MG tablet Take 7.5 mg by mouth at bedtime.  Marland Kitchen OLANZapine (ZYPREXA) 2.5 MG tablet Take 2.5 mg by mouth daily.  Marland Kitchen venlafaxine (EFFEXOR) 75 MG tablet Take 75 mg by mouth daily.  . vitamin B-12 (CYANOCOBALAMIN) 1000 MCG tablet Take 1,000 mcg by mouth daily.   No facility-administered encounter medications on file as of 10/05/2019.     PHYSICAL EXAM / ROS:   Current and past weights: 111.8 lbs, down 3 lbs from last month. General: NAD, frail appearing, thin, voices pain Cardiovascular: no chest pain reported, no edema reported Pulmonary: no cough, no increased SOB, room air Abdomen: appetite fair, denies constipation, incontinent of bowel GU: denies dysuria, incontinent of urine MSK:  no joint deformities, non ambulatory, no falls, lift only  To transfer Skin: no rashes or wounds reported Neurological: Weakness, dementia but able to converse some, family has not visited, but has called on phone. Calm mood, FAST score 7A.  Jason Coop, NP

## 2019-11-11 ENCOUNTER — Other Ambulatory Visit: Payer: Self-pay

## 2019-11-11 ENCOUNTER — Non-Acute Institutional Stay: Payer: Medicare Other | Admitting: Primary Care

## 2019-11-11 DIAGNOSIS — Z515 Encounter for palliative care: Secondary | ICD-10-CM

## 2019-11-11 NOTE — Progress Notes (Signed)
[facility] Consult Note Telephone: [phone]  Fax: [phone]  TELEHEALTH VISIT STATEMENT Due to the COVID-19 crisis, this visit was done via telemedicine from my office. It was initiated and consented to by this patient and/or family.  PATIENT NAME: Kelsey Morrison [id number] [phone] (home)  DOB: February 03, 1934 MRN: [id number]  PRIMARY CARE PROVIDER:   Alvester Morin, MD, [place] [address] [phone]  REFERRING PROVIDER:  Alvester Morin, MD [place]. [place],  [place] [address] [phone]  RESPONSIBLE PARTY:   Extended Emergency Contact Information Primary Emergency Contact: Smylie,Bennie Address: [address]          [place], [place] [address] Johnnette Litter of [place] Phone: [phone] Relation: Spouse Secondary Emergency Contact: Janalyn Harder [place] of [place] Phone: [phone] Work Phone: [phone] Relation: Son   ASSESSMENT AND RECOMMENDATIONS:   1. Advance Care Planning/Goals of Care: Goals include to maximize quality of life and symptom management. DNR on file, no MOST.  2. Symptom Management:   Pain: She has not complained except with rolling. At rest she's comfortable. Tramadol 25 mg in evening and at bedtime. Increase of tramadol 25 mg tid with tid prn order seems to have improved comfort.  Mobility: No recent falls , has decreased with getting OOB in the afternoons.   Mentation: Appears to be sleeping more, less interactive. She is being checked for UTI currently, pending results. This somnolence could be delirium asso with UTI. I will continue to monitor for UTI vs dementia progression.  3. Family /Caregiver/Community Supports: Family calls on phone each night to check on her with staff. Lives in [place]  4. Cognitive / Functional decline: Sleeping more, in bed more. Will continue to  monitor for pattern of decline.  5. Follow up Palliative Care Visit: Palliative care will continue to follow for goals of care clarification and symptom management. Return 4-6 weeks or prn.  I spent 25 minutes providing this consultation,  from 1600 to 1625. More than 50% of the time in this consultation was spent coordinating communication.   HISTORY OF PRESENT ILLNESS:  Kelsey Morrison is a 85 y.o. year old female with multiple medical problems including dementia, h/o covid infection, debility. Palliative Care was asked to follow this patient by consultation request of Slade-Hartman, Ivette Loyal* to help address advance care planning and goals of care. This is a follow up visit.  CODE STATUS: DNR  PPS: 30% HOSPICE ELIGIBILITY/DIAGNOSIS: TBD  PAST MEDICAL HISTORY:  Past Medical History:  Diagnosis Date  . Alzheimer's dementia ([place])   . Anxiety   . Cancer [facility])    breast cancer  . Depression   . Dry mouth   . HLD (hyperlipidemia)   . Hypertension   . Inflammatory arthritis    seronegative  . Osteoarthritis     SOCIAL HX:  Social History   Tobacco Use  . Smoking status: Never Smoker  . Smokeless tobacco: Never Used  Substance Use Topics  . Alcohol use: No    ALLERGIES:  Allergies  Allergen Reactions  . Celecoxib Other (See Comments)  . Hydrocodone Other (See Comments)    Altered mental status     PERTINENT MEDICATIONS:  Outpatient Encounter Medications as of 11/11/2019  Medication Sig  . alendronate (FOSAMAX) 70 MG tablet Take 70 mg by mouth once a week. Take with a full glass of water on an empty stomach.  Marland Kitchen amLODipine (NORVASC) 5 MG  tablet Take 5 mg by mouth daily.  Marland Kitchen antiseptic oral rinse (BIOTENE) LIQD 15 mLs by Mouth Rinse route at bedtime as needed for dry mouth.   Marland Kitchen aspirin EC 81 MG tablet Take 81 mg by mouth at bedtime.  . calcium citrate (CALCITRATE - DOSED IN MG ELEMENTAL CALCIUM) 950 MG tablet Take 200 mg of elemental calcium by mouth daily.  . famotidine  (PEPCID) 20 MG tablet Take 20 mg by mouth daily.  . memantine (NAMENDA) 10 MG tablet Take 10 mg by mouth 2 (two) times daily.  . mirtazapine (REMERON) 7.5 MG tablet Take 7.5 mg by mouth at bedtime.  Marland Kitchen OLANZapine (ZYPREXA) 2.5 MG tablet Take 2.5 mg by mouth daily.  Marland Kitchen venlafaxine (EFFEXOR) 75 MG tablet Take 75 mg by mouth daily.  . vitamin B-12 (CYANOCOBALAMIN) 1000 MCG tablet Take 1,000 mcg by mouth daily.   No facility-administered encounter medications on file as of 11/11/2019.     PHYSICAL EXAM / ROS:   135/73  98.7-103-18   96% on room air  Current and past weights: 115.8 lbs, October was 111.8 lbs. General: NAD, frail, thin Cardiovascular: no chest pain reported, no edema reported Pulmonary: no cough, no increased SOB Abdomen: appetite fair to poor, denies constipation, incontinent of bowel GU: denies dysuria, incontinent of urine, UTI being investigated, will Rx once C&S  comes MSK:  no joint deformities, non ambulatory, uses lift for transfer Skin: no rashes or wounds reported Neurological: Weakness, sleeping more, Dementia Stage 7a-b.  Jason Coop, NP

## 2019-12-08 ENCOUNTER — Non-Acute Institutional Stay: Payer: Medicare Other | Admitting: Primary Care

## 2020-05-12 IMAGING — CT CT HEAD W/O CM
3 series · 15 of 47 positions shown, 18 images · non-contrast
Comparison: 11/20/2018

CLINICAL DATA: Multiple falls

EXAM:
CT HEAD WITHOUT CONTRAST
TECHNIQUE: Contiguous axial images were obtained from the base of the skull
through the vertex without intravenous contrast.

[Series 2: head wo · axial · 0.47mm/px · z∈[-121,+4]mm · 9 of 30 slices shown, 12 images]
[im 3/30  brain]
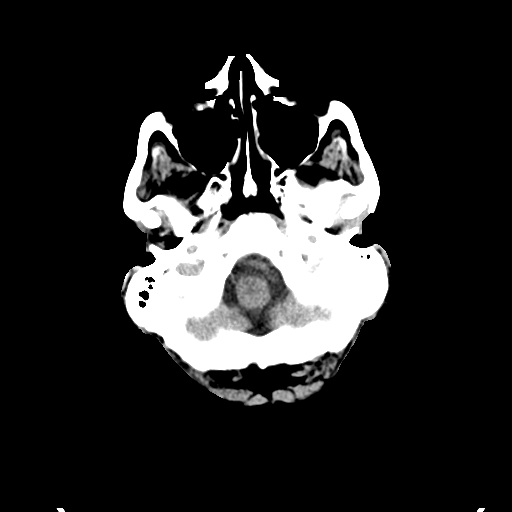
[im 3/30  bone]
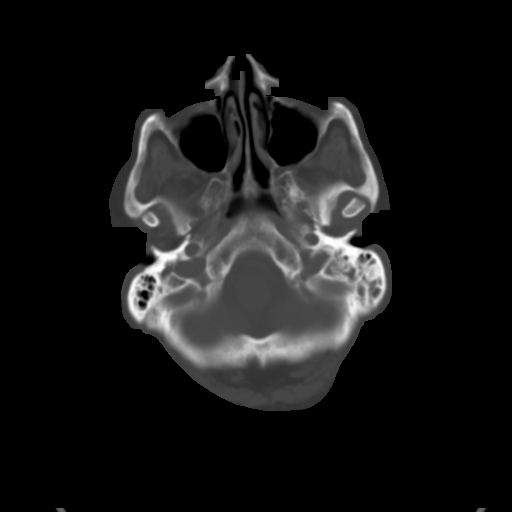
[im 6/30  brain]
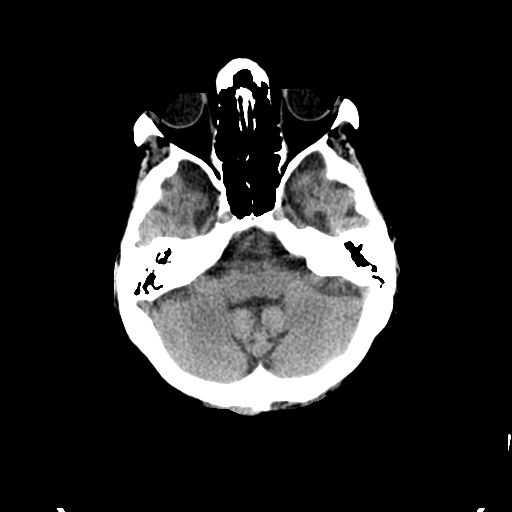
[im 9/30  brain]
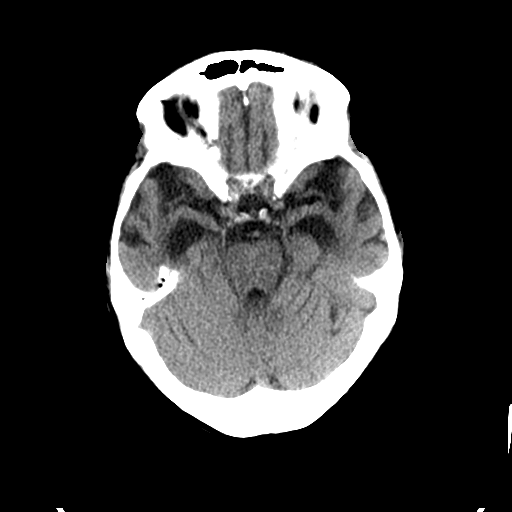
[im 12/30  brain]
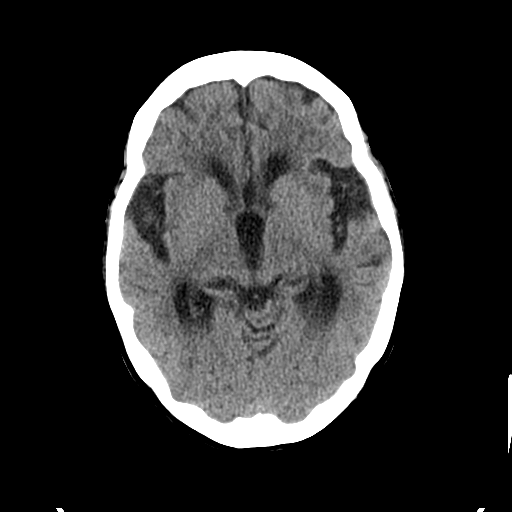
[im 16/30  brain]
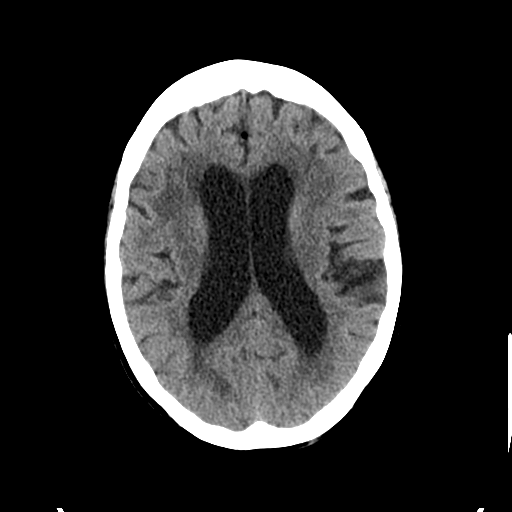
[im 16/30  bone]
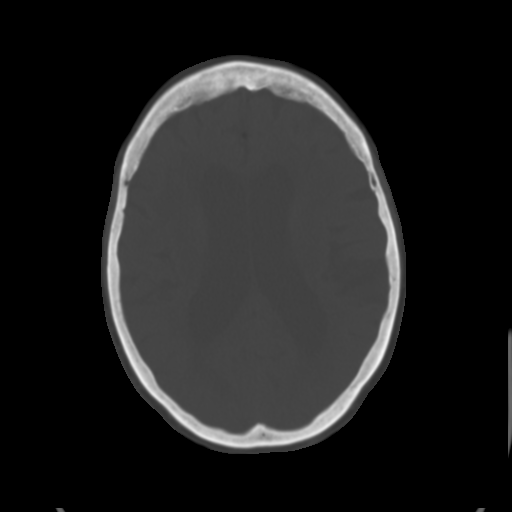
[im 19/30  brain]
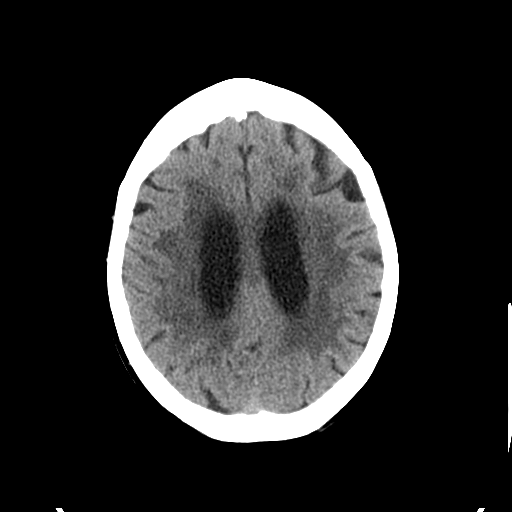
[im 22/30  brain]
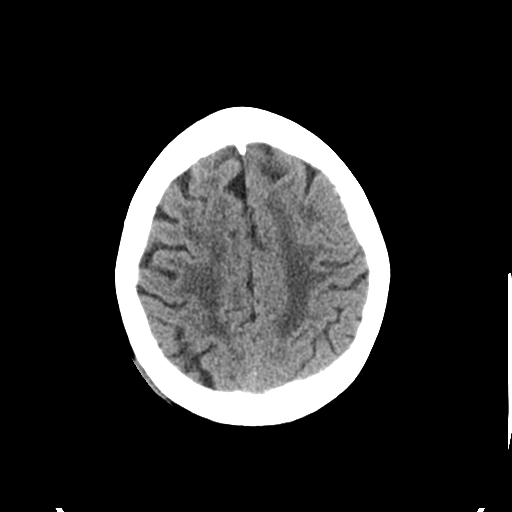
[im 25/30  brain]
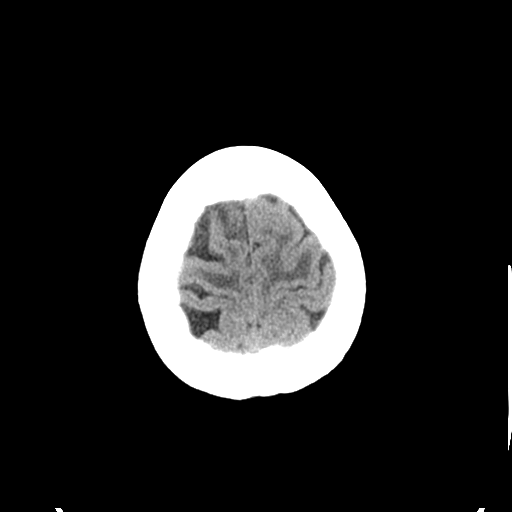
[im 28/30  brain]
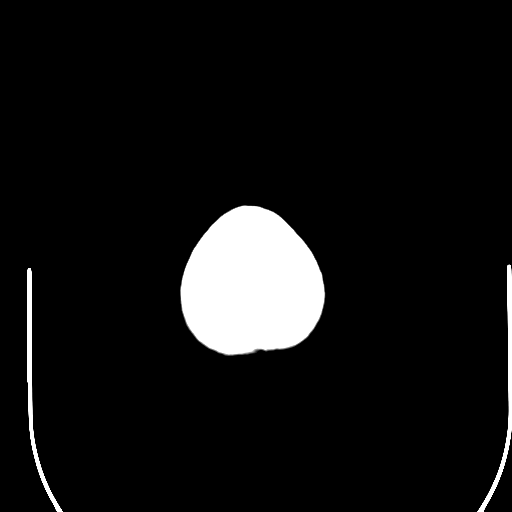
[im 28/30  bone]
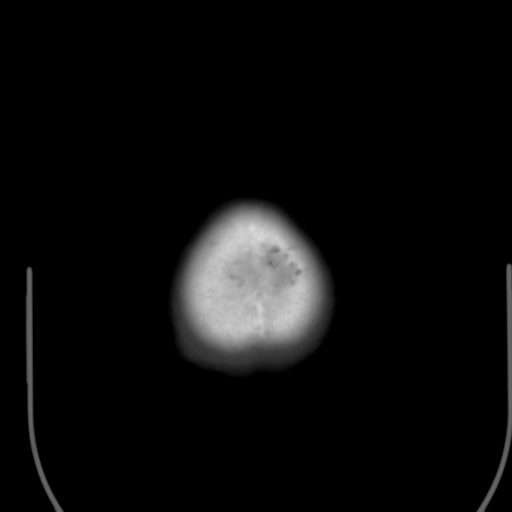

[Series 4: coronal soft tissue · coronal · 0.29mm/px · 3 of 62 slices shown]
[im 21/62  brain]
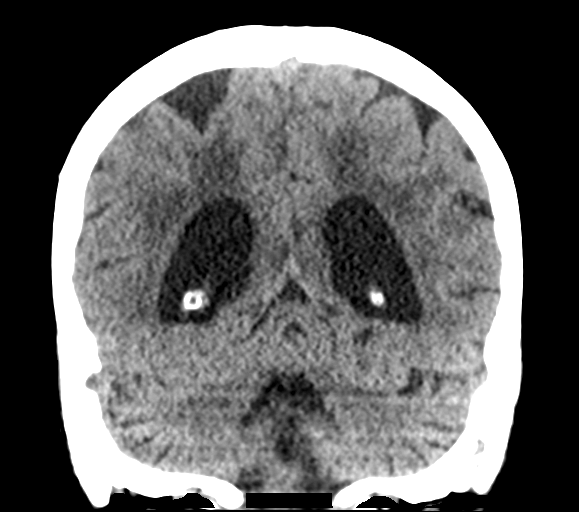
[im 28/62  brain]
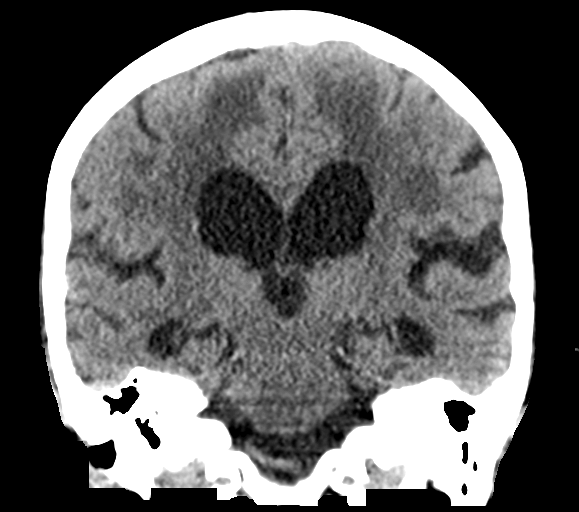
[im 34/62  brain]
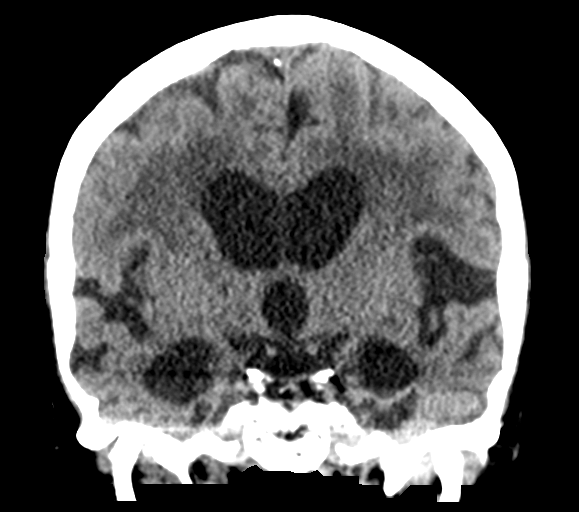

[Series 5: sagittal soft tissue · sagittal · 0.30mm/px · 3 of 49 slices shown]
[im 17/49  brain]
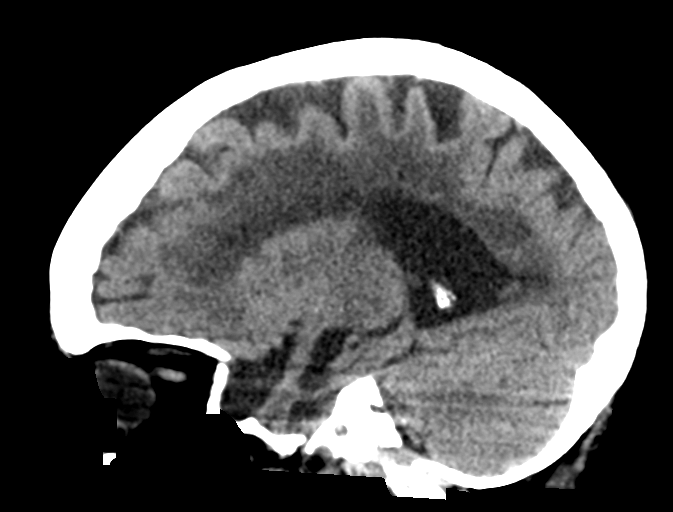
[im 25/49  brain]
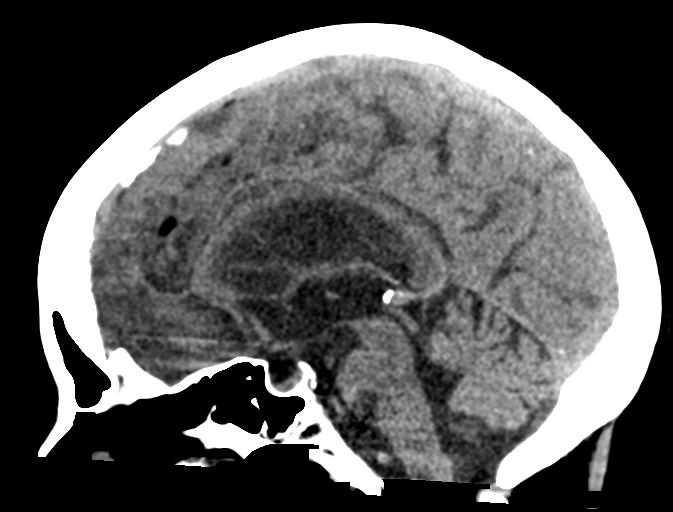
[im 33/49  brain]
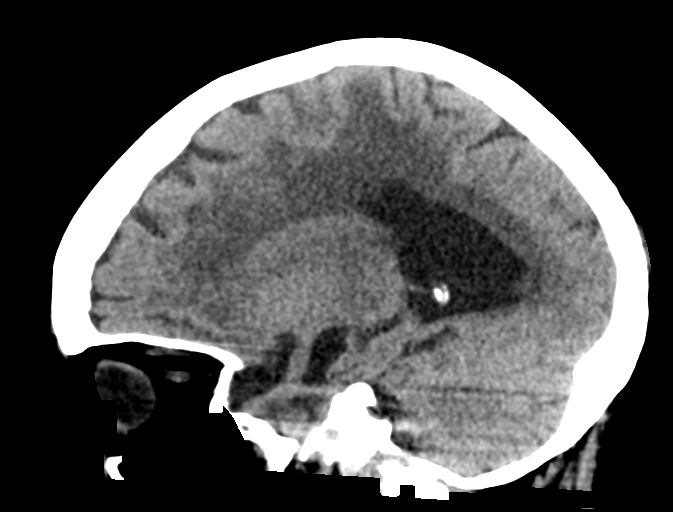

[15 of 47 positions shown; findings below may reference images not displayed]

FINDINGS: Brain: There is atrophy and chronic small vessel disease changes. No
acute intracranial abnormality. Specifically, no hemorrhage,
hydrocephalus, mass lesion, acute infarction, or significant
intracranial injury.

Vascular: No hyperdense vessel or unexpected calcification.

Skull: No acute calvarial abnormality.

Sinuses/Orbits: Visualized paranasal sinuses and mastoids clear.
Orbital soft tissues unremarkable.

Other: None
IMPRESSION: Atrophy, chronic microvascular disease.

No acute intracranial abnormality.

## 2020-09-23 IMAGING — DX PORTABLE CHEST - 1 VIEW
1 series · 1 of 1 positions shown · non-contrast
Comparison: 12/21/2018

CLINICAL DATA: Shortness of breath and poor oxygen saturation.
History of coronavirus infection.

EXAM:
PORTABLE CHEST 1 VIEW

[chest ap]
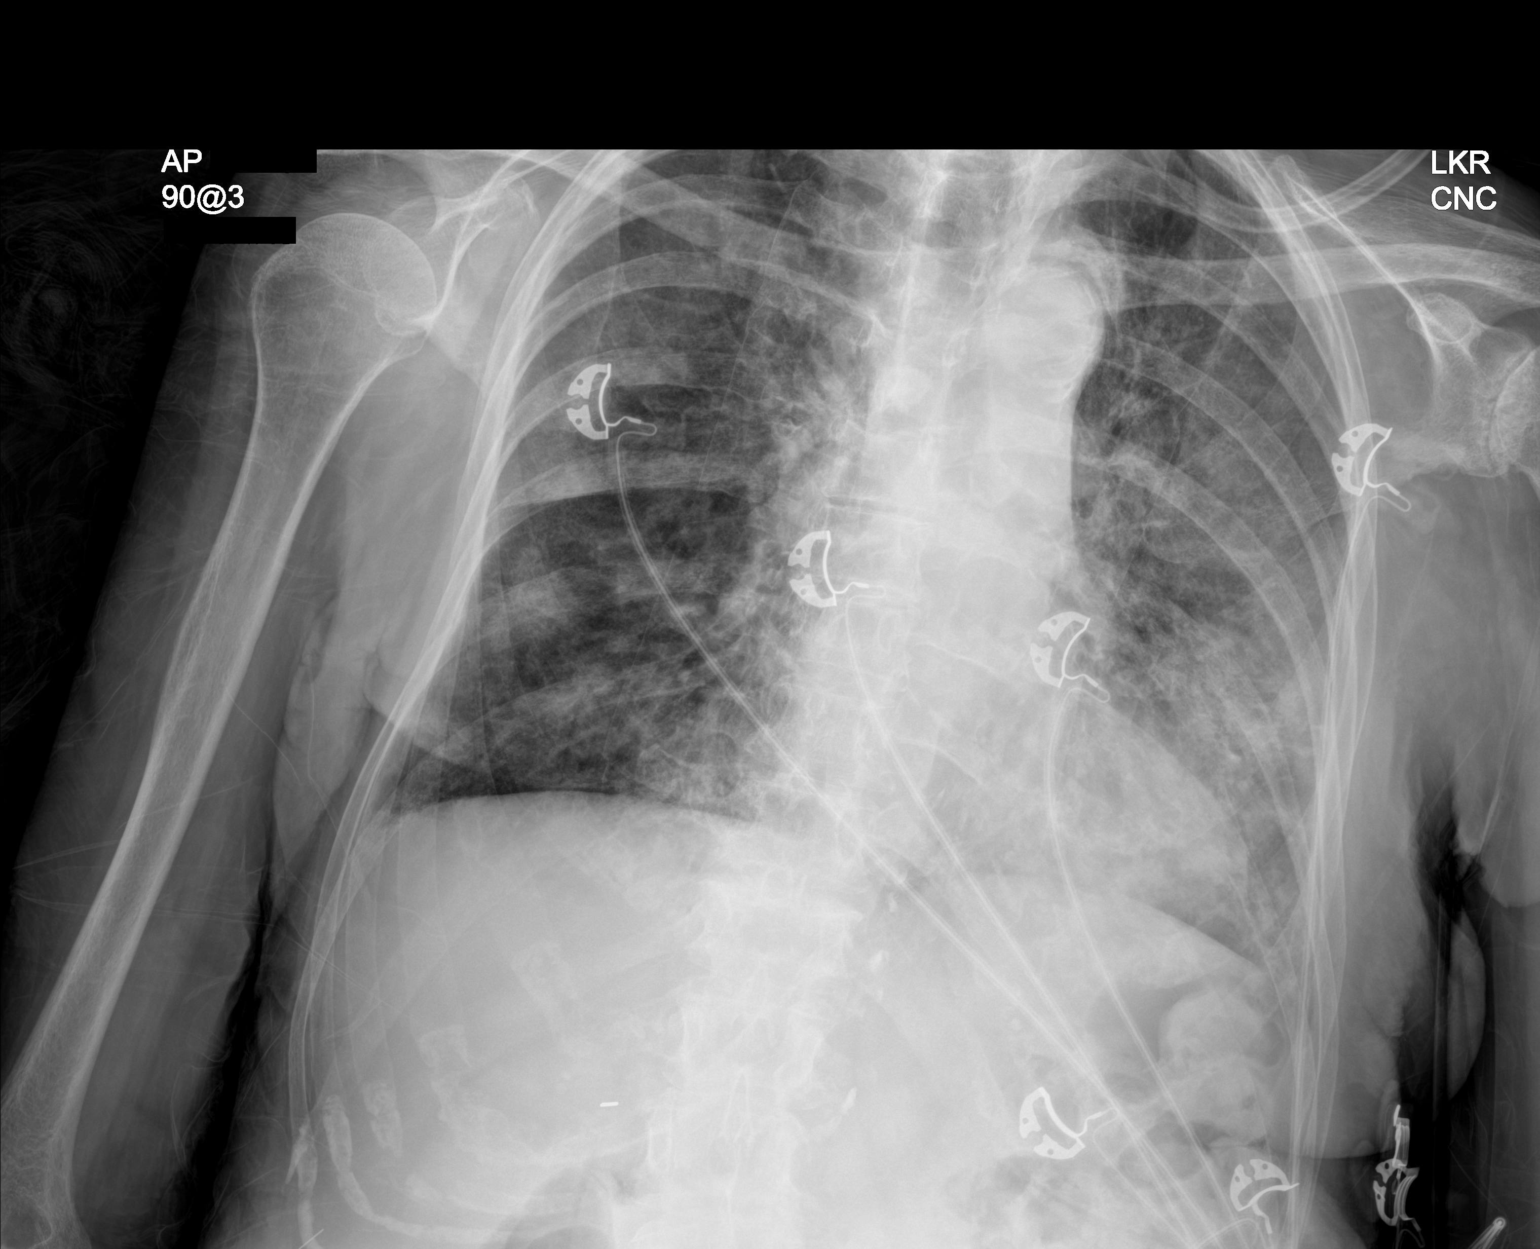

[1 of 1 positions shown; findings below may reference images not displayed]

FINDINGS: Artifact overlies the chest. Heart size remains normal. There is
atherosclerosis of the aorta. There are newly seen widespread patchy
pulmonary infiltrates without dense consolidation. The findings
could be consistent with viral or bacterial pneumonia. No visible
effusion. Chronic degenerative changes of the left shoulder. No
acute bone finding.
IMPRESSION: Newly seen widespread patchy pulmonary infiltrates which could be
consistent with viral or bacterial pneumonia. No dense
consolidation, lobar collapse or visible pleural effusion. Given the
history of coronavirus infection, findings are consistent with that
diagnosis.

## 2021-01-09 DEATH — deceased
